# Patient Record
Sex: Male | Born: 1965 | Race: White | Hispanic: No | Marital: Married | Smoking: Former smoker
Health system: Southern US, Community
[De-identification: ages and names within clinical notes are randomized; demographics above are authoritative.]

## PROBLEM LIST (undated history)

## (undated) DIAGNOSIS — K219 Gastro-esophageal reflux disease without esophagitis: Secondary | ICD-10-CM

## (undated) DIAGNOSIS — I10 Essential (primary) hypertension: Secondary | ICD-10-CM

## (undated) DIAGNOSIS — F419 Anxiety disorder, unspecified: Secondary | ICD-10-CM

## (undated) DIAGNOSIS — I2510 Atherosclerotic heart disease of native coronary artery without angina pectoris: Secondary | ICD-10-CM

## (undated) DIAGNOSIS — I219 Acute myocardial infarction, unspecified: Secondary | ICD-10-CM

## (undated) DIAGNOSIS — E785 Hyperlipidemia, unspecified: Secondary | ICD-10-CM

## (undated) DIAGNOSIS — F410 Panic disorder [episodic paroxysmal anxiety] without agoraphobia: Secondary | ICD-10-CM

## (undated) DIAGNOSIS — I429 Cardiomyopathy, unspecified: Secondary | ICD-10-CM

## (undated) DIAGNOSIS — Z9889 Other specified postprocedural states: Secondary | ICD-10-CM

## (undated) DIAGNOSIS — E0590 Thyrotoxicosis, unspecified without thyrotoxic crisis or storm: Secondary | ICD-10-CM

## (undated) DIAGNOSIS — I499 Cardiac arrhythmia, unspecified: Secondary | ICD-10-CM

## (undated) DIAGNOSIS — J45909 Unspecified asthma, uncomplicated: Secondary | ICD-10-CM

## (undated) DIAGNOSIS — E119 Type 2 diabetes mellitus without complications: Secondary | ICD-10-CM

## (undated) HISTORY — DX: Gastro-esophageal reflux disease without esophagitis: K21.9

## (undated) HISTORY — DX: Cardiac arrhythmia, unspecified: I49.9

## (undated) HISTORY — PX: TONSILLECTOMY: SUR1361

## (undated) HISTORY — DX: Type 2 diabetes mellitus without complications: E11.9

## (undated) HISTORY — DX: Atherosclerotic heart disease of native coronary artery without angina pectoris: I25.10

## (undated) HISTORY — DX: Acute myocardial infarction, unspecified: I21.9

## (undated) HISTORY — DX: Essential (primary) hypertension: I10

## (undated) HISTORY — PX: CARDIAC CATHETERIZATION: SHX172

## (undated) HISTORY — PX: CORONARY ANGIOPLASTY: SHX604

## (undated) HISTORY — PX: CORONARY STENT PLACEMENT: SHX6853

---

## 2001-04-13 DIAGNOSIS — Z9889 Other specified postprocedural states: Secondary | ICD-10-CM

## 2001-04-13 HISTORY — DX: Other specified postprocedural states: Z98.890

## 2013-04-13 DIAGNOSIS — I219 Acute myocardial infarction, unspecified: Secondary | ICD-10-CM

## 2013-04-13 HISTORY — DX: Acute myocardial infarction, unspecified: I21.9

## 2014-02-06 ENCOUNTER — Inpatient Hospital Stay: Payer: Self-pay | Admitting: Emergency Medicine

## 2014-02-06 LAB — BASIC METABOLIC PANEL
Anion Gap: 7 (ref 7–16)
BUN: 23 mg/dL — AB (ref 7–18)
CHLORIDE: 107 mmol/L (ref 98–107)
CREATININE: 0.83 mg/dL (ref 0.60–1.30)
Calcium, Total: 8.4 mg/dL — ABNORMAL LOW (ref 8.5–10.1)
Co2: 25 mmol/L (ref 21–32)
EGFR (African American): >60
EGFR (Non-African Amer.): >60
Glucose: 206 mg/dL — ABNORMAL HIGH (ref 65–99)
OSMOLALITY: 287 (ref 275–301)
Potassium: 4.1 mmol/L (ref 3.5–5.1)
Sodium: 139 mmol/L (ref 136–145)

## 2014-02-06 LAB — LIPID PANEL
Cholesterol: 135 mg/dL (ref 0–200)
HDL Cholesterol: 40 mg/dL (ref 40–60)
Ldl Cholesterol, Calc: 81 mg/dL (ref 0–100)
Triglycerides: 70 mg/dL (ref 0–200)
VLDL Cholesterol, Calc: 14 mg/dL (ref 5–40)

## 2014-02-06 LAB — URINALYSIS, COMPLETE
Bacteria: NONE SEEN
Bilirubin,UR: NEGATIVE
Blood: NEGATIVE
Ketone: NEGATIVE
LEUKOCYTE ESTERASE: NEGATIVE
Nitrite: NEGATIVE
Ph: 6 (ref 4.5–8.0)
Protein: NEGATIVE
RBC,UR: 1 /HPF (ref 0–5)
SPECIFIC GRAVITY: 1.007 (ref 1.003–1.030)
SQUAMOUS EPITHELIAL: NONE SEEN
WBC UR: NONE SEEN /HPF (ref 0–5)

## 2014-02-06 LAB — CK-MB
CK-MB: 2.7 ng/mL (ref 0.5–3.6)
CK-MB: 6.2 ng/mL — ABNORMAL HIGH (ref 0.5–3.6)
CK-MB: 7.5 ng/mL — AB (ref 0.5–3.6)

## 2014-02-06 LAB — CBC
HCT: 45.2 % (ref 40.0–52.0)
HGB: 14.4 g/dL (ref 13.0–18.0)
MCH: 30 pg (ref 26.0–34.0)
MCHC: 31.8 g/dL — ABNORMAL LOW (ref 32.0–36.0)
MCV: 95 fL (ref 80–100)
PLATELETS: 178 10*3/uL (ref 150–440)
RBC: 4.78 10*6/uL (ref 4.40–5.90)
RDW: 13.2 % (ref 11.5–14.5)
WBC: 8.9 10*3/uL (ref 3.8–10.6)

## 2014-02-06 LAB — TROPONIN I
Troponin-I: 0.04 ng/mL
Troponin-I: 0.49 ng/mL — ABNORMAL HIGH
Troponin-I: 2.2 ng/mL — ABNORMAL HIGH
Troponin-I: 2.9 ng/mL — ABNORMAL HIGH

## 2014-02-06 LAB — PROTIME-INR
INR: 1
Prothrombin Time: 12.8 secs (ref 11.5–14.7)

## 2014-02-06 LAB — HEPARIN LEVEL (UNFRACTIONATED): Anti-Xa(Unfractionated): 0.41 IU/mL (ref 0.30–0.70)

## 2014-02-06 LAB — PRO B NATRIURETIC PEPTIDE: B-Type Natriuretic Peptide: 18 pg/mL (ref 0–125)

## 2014-02-06 LAB — APTT: Activated PTT: 30.3 secs (ref 23.6–35.9)

## 2014-02-06 LAB — HEMOGLOBIN A1C: Hemoglobin A1C: 7.7 % — ABNORMAL HIGH (ref 4.2–6.3)

## 2014-02-07 LAB — CBC WITH DIFFERENTIAL/PLATELET
BASOS PCT: 0.8 %
Basophil #: 0.1 10*3/uL (ref 0.0–0.1)
EOS ABS: 0.2 10*3/uL (ref 0.0–0.7)
Eosinophil %: 2.8 %
HCT: 48.7 % (ref 40.0–52.0)
HGB: 15.8 g/dL (ref 13.0–18.0)
LYMPHS ABS: 2.2 10*3/uL (ref 1.0–3.6)
Lymphocyte %: 25.7 %
MCH: 30.4 pg (ref 26.0–34.0)
MCHC: 32.5 g/dL (ref 32.0–36.0)
MCV: 94 fL (ref 80–100)
Monocyte #: 0.6 x10 3/mm (ref 0.2–1.0)
Monocyte %: 7.1 %
Neutrophil #: 5.6 10*3/uL (ref 1.4–6.5)
Neutrophil %: 63.6 %
Platelet: 185 10*3/uL (ref 150–440)
RBC: 5.2 10*6/uL (ref 4.40–5.90)
RDW: 13.3 % (ref 11.5–14.5)
WBC: 8.7 10*3/uL (ref 3.8–10.6)

## 2014-02-07 LAB — BASIC METABOLIC PANEL
ANION GAP: 5 — AB (ref 7–16)
BUN: 14 mg/dL (ref 7–18)
CALCIUM: 8.8 mg/dL (ref 8.5–10.1)
CHLORIDE: 104 mmol/L (ref 98–107)
CREATININE: 0.83 mg/dL (ref 0.60–1.30)
Co2: 29 mmol/L (ref 21–32)
EGFR (Non-African Amer.): >60
GLUCOSE: 154 mg/dL — AB (ref 65–99)
OSMOLALITY: 279 (ref 275–301)
Potassium: 3.9 mmol/L (ref 3.5–5.1)
SODIUM: 138 mmol/L (ref 136–145)

## 2014-02-07 LAB — APTT: Activated PTT: 72.6 secs — ABNORMAL HIGH (ref 23.6–35.9)

## 2014-02-07 LAB — HEPARIN LEVEL (UNFRACTIONATED): ANTI-XA(UNFRACTIONATED): 0.45 [IU]/mL (ref 0.30–0.70)

## 2014-02-08 LAB — CBC WITH DIFFERENTIAL/PLATELET
Basophil #: 0.3 10*3/uL — ABNORMAL HIGH (ref 0.0–0.1)
Basophil %: 4.3 %
EOS ABS: 0.2 10*3/uL (ref 0.0–0.7)
EOS PCT: 2.6 %
HCT: 46.7 % (ref 40.0–52.0)
HGB: 15.4 g/dL (ref 13.0–18.0)
Lymphocyte #: 1.4 10*3/uL (ref 1.0–3.6)
Lymphocyte %: 19.6 %
MCH: 31.3 pg (ref 26.0–34.0)
MCHC: 33 g/dL (ref 32.0–36.0)
MCV: 95 fL (ref 80–100)
Monocyte #: 0.6 x10 3/mm (ref 0.2–1.0)
Monocyte %: 7.8 %
Neutrophil #: 4.7 10*3/uL (ref 1.4–6.5)
Neutrophil %: 65.7 %
Platelet: 173 10*3/uL (ref 150–440)
RBC: 4.91 10*6/uL (ref 4.40–5.90)
RDW: 13 % (ref 11.5–14.5)
WBC: 7.2 10*3/uL (ref 3.8–10.6)

## 2014-02-08 LAB — BASIC METABOLIC PANEL
ANION GAP: 7 (ref 7–16)
BUN: 15 mg/dL (ref 7–18)
CHLORIDE: 104 mmol/L (ref 98–107)
CO2: 29 mmol/L (ref 21–32)
Calcium, Total: 8.3 mg/dL — ABNORMAL LOW (ref 8.5–10.1)
Creatinine: 0.95 mg/dL (ref 0.60–1.30)
EGFR (African American): >60
EGFR (Non-African Amer.): >60
Glucose: 213 mg/dL — ABNORMAL HIGH (ref 65–99)
Osmolality: 287 (ref 275–301)
Potassium: 3.9 mmol/L (ref 3.5–5.1)
SODIUM: 140 mmol/L (ref 136–145)

## 2014-02-08 LAB — CK TOTAL AND CKMB (NOT AT ARMC)
CK, Total: 85 U/L
CK-MB: 3 ng/mL (ref 0.5–3.6)

## 2014-02-13 DIAGNOSIS — I429 Cardiomyopathy, unspecified: Secondary | ICD-10-CM | POA: Insufficient documentation

## 2014-04-05 ENCOUNTER — Emergency Department: Payer: Self-pay | Admitting: Emergency Medicine

## 2014-04-05 LAB — PRO B NATRIURETIC PEPTIDE: B-Type Natriuretic Peptide: 30 pg/mL (ref 0–125)

## 2014-04-05 LAB — CBC
HCT: 43.4 % (ref 40.0–52.0)
HGB: 14.5 g/dL (ref 13.0–18.0)
MCH: 31.1 pg (ref 26.0–34.0)
MCHC: 33.3 g/dL (ref 32.0–36.0)
MCV: 93 fL (ref 80–100)
PLATELETS: 210 10*3/uL (ref 150–440)
RBC: 4.65 10*6/uL (ref 4.40–5.90)
RDW: 12.9 % (ref 11.5–14.5)
WBC: 8.5 10*3/uL (ref 3.8–10.6)

## 2014-04-05 LAB — BASIC METABOLIC PANEL
Anion Gap: 7 (ref 7–16)
BUN: 17 mg/dL (ref 7–18)
CALCIUM: 9.1 mg/dL (ref 8.5–10.1)
CHLORIDE: 106 mmol/L (ref 98–107)
CO2: 28 mmol/L (ref 21–32)
CREATININE: 0.86 mg/dL (ref 0.60–1.30)
EGFR (African American): >60
EGFR (Non-African Amer.): >60
GLUCOSE: 125 mg/dL — AB (ref 65–99)
OSMOLALITY: 284 (ref 275–301)
Potassium: 4.1 mmol/L (ref 3.5–5.1)
Sodium: 141 mmol/L (ref 136–145)

## 2014-04-05 LAB — PROTIME-INR
INR: 1
Prothrombin Time: 13.5 secs (ref 11.5–14.7)

## 2014-04-05 LAB — TROPONIN I
Troponin-I: <0.02 ng/mL
Troponin-I: <0.02 ng/mL

## 2014-06-26 ENCOUNTER — Encounter
Admit: 2014-06-26 | Disposition: A | Discharge status: Home / Self Care | Payer: Self-pay | Attending: Internal Medicine | Admitting: Internal Medicine

## 2014-07-02 DIAGNOSIS — E0590 Thyrotoxicosis, unspecified without thyrotoxic crisis or storm: Secondary | ICD-10-CM | POA: Insufficient documentation

## 2014-07-13 ENCOUNTER — Encounter
Admit: 2014-07-13 | Disposition: A | Discharge status: Home / Self Care | Payer: Self-pay | Attending: Internal Medicine | Admitting: Internal Medicine

## 2014-08-04 NOTE — Consult Note (Signed)
Chief Complaint:  Subjective/Chief Complaint States to feel reasonably well denies any groin pain chest pain is improved not with a short of breath he has ambulated well. feels well enough to go home is worried about when he return to work and heavy lifting.   VITAL SIGNS/ANCILLARY NOTES: **Vital Signs.:   29-Oct-15 07:00  Vital Signs Type Routine  Pulse Pulse 67  Respirations Respirations 13  Systolic BP Systolic BP 517  Diastolic BP (mmHg) Diastolic BP (mmHg) 78  Mean BP 88  Pulse Ox % Pulse Ox % 97  Pulse Ox Activity Level  At rest  Oxygen Delivery Room Air/ 21 %  *Intake and Output.:   Shift 29-Oct-15 07:00  IV (Secondary)      In:  300  Urine ml     Out:  950  Length of Stay Totals Intake:  2671.3 Output:  2150    Net:  521.3   Brief Assessment:  GEN well developed, well nourished, no acute distress   Cardiac Regular   Respiratory normal resp effort  clear BS   Gastrointestinal Normal   Gastrointestinal details normal Soft  Nontender  Nondistended  No masses palpable   EXTR negative cyanosis/clubbing, negative edema   Lab Results: Routine Chem:  29-Oct-15 01:20   Glucose, Serum  213  BUN 15  Creatinine (comp) 0.95  Sodium, Serum 140  Potassium, Serum 3.9  Chloride, Serum 104  CO2, Serum 29  Calcium (Total), Serum  8.3  Anion Gap 7  Osmolality (calc) 287  eGFR (African American) >60  eGFR (Non-African American) >60 (eGFR values <27m/min/1.73 m2 may be an indication of chronic kidney disease (CKD). Calculated eGFR, using the MRDR Study equation, is useful in  patients with stable renal function. The eGFR calculation will not be reliable in acutely ill patients when serum creatinine is changing rapidly. It is not useful in patients on dialysis. The eGFR calculation may not be applicable to patients at the low and high extremes of body sizes, pregnant women, and vetetarians.)  Cardiac:  29-Oct-15 01:20   CK, Total 85 (39-308 NOTE: NEW REFERENCE  RANGE  05/15/2013)  CPK-MB, Serum 3.0 (Result(s) reported on 08 Feb 2014 at 01:55AM.)  Routine Hem:  29-Oct-15 01:20   WBC (CBC) 7.2  RBC (CBC) 4.91  Hemoglobin (CBC) 15.4  Hematocrit (CBC) 46.7  Platelet Count (CBC) 173  MCV 95  MCH 31.3  MCHC 33.0  RDW 13.0  Neutrophil % 65.7  Lymphocyte % 19.6  Monocyte % 7.8  Eosinophil % 2.6  Basophil % 4.3  Neutrophil # 4.7  Lymphocyte # 1.4  Monocyte # 0.6  Eosinophil # 0.2  Basophil #  0.3 (Result(s) reported on 08 Feb 2014 at 01:39AM.)   Radiology Results: Cardiology:    27-Oct-15 06:07, ED ECG  Ventricular Rate 75  Atrial Rate 75  P-R Interval 194  QRS Duration 108  QT 386  QTc 431  P Axis 51  R Axis 10  T Axis 43  ECG interpretation   Normal sinus rhythm  Normal ECG  No previous ECGs available  ----------unconfirmed----------  Confirmed by OVERREAD, NOT ([id number]), editor PEARSON, BARBARA ([id number] on 02/06/2014 12:09:50 PM  ED ECG    Assessment/Plan:  Assessment/Plan:  Assessment IMP  coronary disease  unstable angina  non STEMI  hyperlipidemia  hypertension  status post PCI and stent DES  diabetes   Plan PLAN  Brilinta twice a day 90 mg  aspirin 81 mg once a day  low-dose beta-blockade therapy  statin therapy for hyperlipidemia  continue diabetes management  increase activity  continue diabetes management control  have the patient distress today  for follow-up 1-2 weeks as out patient   Electronic Signatures: Lujean Amel D (MD)  (Signed 30-Oct-15 10:38)  Authored: Chief Complaint, VITAL SIGNS/ANCILLARY NOTES, Brief Assessment, Lab Results, Radiology Results, Assessment/Plan   Last Updated: 30-Oct-15 10:38 by Yolonda Kida (MD)

## 2014-08-04 NOTE — Consult Note (Signed)
PATIENT NAME:  Wesley Daniels, Wesley Daniels MR#:  [id number] DATE OF BIRTH:  [birth year]  DATE OF CONSULTATION:  02/06/2014  REFERRING PHYSICIAN:   Dr. Allena Katz CONSULTING PHYSICIAN:  Jericho Alcorn D. Taylorann Tkach, MD  REASON FOR CONSULTATION: A patient with unstable angina, possible non-Q-wave myocardial infarction.   HISTORY OF PRESENT ILLNESS: The patient is a 49 year old white male with past history of hypertension and diabetes, cardiomyopathy, ejection fraction of around 45%, smoking, who cam to the Emergency Room with unstable anginal symptoms that woke him up. He had mid sternal chest pain  radiating to both arms.  He denied diaphoresis, nausea, vomiting and shortness of breath.  He took 4 aspirin from his wife;  finally came to the Emergency Room. EKG was unremarkable.  First cardiac enzymes were elevated at 0.492. He was admitted for possible non-STEMI.  Chest pain symptoms improved.  He has significant risk factors and prior  cardiac disease. He was placed on heparin and was advised to be admitted for further evaluation and care.   PAST MEDICAL HISTORY: Hypertension, diabetes, mild cardiomyopathy, smoking.   ALLERGIES:   FISH   MEDICATIONS: Lisinopril 5 a day, Pravachol once a day.   FAMILY HISTORY: Myocardial infarction in his mother.   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   REVIEW OF SYSTEMS: Denies blackout spells or syncope. Denies nausea or vomiting. Denies fever, chills, sweats. No weight loss. No weight gain. No hemoptysis or hematemesis. No bright red blood per rectum. Denies vision change or hearing change. No significant sputum production or cough.    PHYSICAL EXAMINATION: VITAL SIGNS: Blood pressure 120/80, pulse 70, respiratory rate of 16, afebrile.  HEENT: Normocephalic, atraumatic. Pupils equal and reactive to light.  NECK: Supple. No significant JVD, bruits or adenopathy.   LUNGS: Clear to auscultation and percussion. No significant wheeze, rhonchi, or rale.  HEART: Regular rate  and rhythm. Soft systolic murmur at the apex.  ABDOMEN: Benign.  EXTREMITIES:  Within normal limits. NEUROLOGIC:  Intact. SKIN:  Normal.   EKG: Normal sinus rhythm, nonspecific findings with first troponin of 0.49. Follow-up troponins were greater than 2.  Chest x-ray negative. CBC normal. Basic medical profile normal except for glucose of 206. BNP was normal. PT/INR were normal, liver profile is normal.    ASSESSMENT:  Possible non-Q-wave myocardial infarction, history of hypertension, history of hyperlipidemia, history of diabetes, history of smoking, possible cardiomyopathy in the past, presents with   of the symptoms, elevated troponin.   PLAN:  1.  I agree with admit. Follow up troponins. Follow up EKGs. Follow up cardiac enzymes. Continue anticoagulation. Would recommend invasive evaluation with cardiac catheterization with elevated cardiac enzymes and adequate history for angina.  2.  Evaluation for possible cardiomyopathy. Echocardiogram will be helpful for evaluation and treatment of cardiomyopathy, unclear etiology. Continue ACE inhibitors, maybe consider beta blocker.  3.  Diabetes.  Recommend fasting sugars, hemoglobin A1c.  Institute therapy if necessary, institute diet control.  Initial glucose was over 200.  4.   Hypertension. Recommend ACE inhibitor, possibly a beta blocker for stricter control.  5.  Smoking, advised the patient to refrain from tobacco abuse as part of secondary prevention.    6.  Lipid profile and evaluation. Reportedly was on a statin in the past but quit taking it for some reason.  We will proceed with further evaluation and treatment with statin therapy as necessary. 7.  Deep vein thrombosis prophylaxis.  heparin drip for now  , we should be safe to proceed with  the planned echocardiogram and possible cardiac catheterization.   ____________________________ Bobbie Stack Juliann Pares, MD ddc:jp D: 02/07/2014 07:30:48 ET T: 02/07/2014 08:13:03  ET JOB#: [id number]  cc: Danisa Kopec D. Juliann Pares, MD, <Dictator> Alwyn Pea MD ELECTRONICALLY SIGNED 03/12/2014 9:54

## 2014-08-04 NOTE — Discharge Summary (Signed)
PATIENT NAME:  Wesley Daniels, Wesley Daniels MR#:  [id number] DATE OF BIRTH:  1966-03-05  DATE OF ADMISSION:  02/06/2014 DATE OF DISCHARGE:  02/08/2014  DISCHARGE DIAGNOSES: 1.  Non-ST elevated myocardial infarction, acute.  2.  History ischemic cardiomyopathy, ejection fraction 45%.  3.  Type 1 diabetes.  4.  Hypertension.   DISCHARGE MEDICATIONS: Per [facility] med reconciliation system. Please see for details. Briefly, he will stay on his insulin pump.  He will use the Brilinta prescribed by interventional cardiology as well as a beta blocker post MI. Will consider increasing his statin regimen in the future pending results and discussion about tolerability etc.   HISTORY OF PRESENT ILLNESS:  Please see detailed H and P done on admission.   HOSPITAL COURSE: The patient was admitted with chest pressure, high risk given the diabetes, His hemoglobin A1c was controlled at 7.7, but his troponin increased to 0.49, his CK-MB also increased 6.2, troponin then further increased to 2.2, and seemed to peak at approximately 2.9 with the CK-MB peaking at 7.5. Cardiology was consulted. Cardiac catheterization was done and stent was put in his right coronary artery. He is doing well following that and ambulating without any chest pain, etc. Diabetes remained reasonably well controlled.  He continued with his pump therapy. He will follow up soon with me and cardiology to further assess his needs as noted above.   TIME SPENT: Approximately 35 minutes to those discharge tasks today.    ____________________________ Marya Amsler. Dareen Piano, MD mwa:jp D: 02/08/2014 07:43:38 ET T: 02/08/2014 16:13:52 ET JOB#: [id number]  cc: Marya Amsler. Dareen Piano, MD, <Dictator> Lauro Regulus MD ELECTRONICALLY SIGNED 02/09/2014 8:21

## 2014-08-04 NOTE — Consult Note (Signed)
Chief Complaint:  Subjective/Chief Complaint A patient with unstable anginal symptoms ready for catheterization today improve chest pain mild weakness and fatigue   VITAL SIGNS/ANCILLARY NOTES: **Vital Signs.:   28-Oct-15 11:00  Vital Signs Type Routine  Temperature Temperature (F) 98.2  Celsius 36.7  Temperature Source oral  Pulse Pulse 66  Respirations Respirations 18  Systolic BP Systolic BP 161  Diastolic BP (mmHg) Diastolic BP (mmHg) 72  Mean BP 84  Pulse Ox % Pulse Ox % 96  Pulse Ox Activity Level  At rest  Oxygen Delivery Room Air/ 21 %  *Intake and Output.:   Daily 28-Oct-15 07:00  Grand Totals Intake:  1667.5 Output:      Net:  1667.5 24 Hr.:  1667.5  Oral Intake      In:  240  Heparin      In:  156  IV (Primary)      In:  71.5  IV (Secondary)      In:  1200  Urine ml     Out:  0  Length of Stay Totals Intake:  1667.5 Output:      Net:  1667.5   Brief Assessment:  GEN well developed, well nourished, no acute distress   Cardiac Regular  no murmur   Respiratory normal resp effort  clear BS   Gastrointestinal Normal   Gastrointestinal details normal Soft   EXTR negative cyanosis/clubbing, negative edema   Lab Results: Cardiology:  28-Oct-15 16:54   Ventricular Rate 64  Atrial Rate 64  P-R Interval 180  QRS Duration 106  QT 410  QTc 422  P Axis 43  R Axis 8  T Axis 8  ECG interpretation Normal sinus rhythm Normal ECG When compared with ECG of 06-Feb-2014 06:07, T wave inversion now evident in Inferior leads ----------unconfirmed---------- Confirmed by OVERREAD, NOT ([id number]), editor PEARSON, BARBARA ([id number]) on 02/08/2014 9:47:25 AM  Routine Chem:  28-Oct-15 07:03   Glucose, Serum  154  BUN 14  Creatinine (comp) 0.83  Sodium, Serum 138  Potassium, Serum 3.9  Chloride, Serum 104  CO2, Serum 29  Calcium (Total), Serum 8.8  Anion Gap  5  Osmolality (calc) 279  eGFR (African American) >60  eGFR (Non-African American) >60 (eGFR values  <34m/min/1.73 m2 may be an indication of chronic kidney disease (CKD). Calculated eGFR, using the MRDR Study equation, is useful in  patients with stable renal function. The eGFR calculation will not be reliable in acutely ill patients when serum creatinine is changing rapidly. It is not useful in patients on dialysis. The eGFR calculation may not be applicable to patients at the low and high extremes of body sizes, pregnant women, and vetetarians.)  Routine Coag:  28-Oct-15 07:03   Activated PTT (APTT)  72.6 (A HCT value >55% may artifactually increase the APTT. In one study, the increase was an average of 19%. Reference: "Effect on Routine and Special Coagulation Testing Values of Citrate Anticoagulant Adjustment in Patients with High HCT Values." American Journal of Clinical Pathology 2006;126:400-405.)  Routine Hem:  28-Oct-15 07:03   WBC (CBC) 8.7  RBC (CBC) 5.20  Hemoglobin (CBC) 15.8  Hematocrit (CBC) 48.7  Platelet Count (CBC) 185  MCV 94  MCH 30.4  MCHC 32.5  RDW 13.3  Neutrophil % 63.6  Lymphocyte % 25.7  Monocyte % 7.1  Eosinophil % 2.8  Basophil % 0.8  Neutrophil # 5.6  Lymphocyte # 2.2  Monocyte # 0.6  Eosinophil # 0.2  Basophil # 0.1 (Result(s) reported on 07 Feb 2014 at 07:43AM.)   Radiology Results: XRay:    27-Oct-15 07:01, Chest Portable Single View  Chest Portable Single View   REASON FOR EXAM:    chest pain  COMMENTS:       PROCEDURE: DXR - DXR PORTABLE CHEST SINGLE VIEW  - Feb 06 2014  7:01AM     CLINICAL DATA:  Mid region chest pressure    EXAM:  PORTABLE CHEST - 1 VIEW    COMPARISON:  None.    FINDINGS:  There is no edema or consolidation. Heart is upper normal in size  with pulmonary vascularity within normal limits. No adenopathy. No  pneumothorax. No bone lesions.     IMPRESSION:  No edema or consolidation.      Electronically Signed    By: Lowella Grip M.D.    On: 02/06/2014 07:09         Verified By: Leafy Kindle.  Jasmine December, M.D.,  Cardiology:    27-Oct-15 06:07, ED ECG  Ventricular Rate 75  Atrial Rate 75  P-R Interval 194  QRS Duration 108  QT 386  QTc 431  P Axis 51  R Axis 10  T Axis 43  ECG interpretation   Normal sinus rhythm  Normal ECG  No previous ECGs available  ----------unconfirmed----------  Confirmed by OVERREAD, NOT ([id number]), editor PEARSON, BARBARA ([id number]) on 02/06/2014 12:09:50 PM  ED ECG     27-Oct-15 14:49, Echo Doppler  Echo Doppler   REASON FOR EXAM:      COMMENTS:       PROCEDURE: [facility] - ECHO DOPPLER COMPLETE(TRANSTHOR)  - Feb 06 2014  2:49PM     RESULT: Echocardiogram Report    Patient Name:   Wesley Daniels Date of Exam: 02/06/2014  Medical Rec #:  [id number]         Custom1:  Dateof Birth:  1966-02-17      Height:       74.0 in  Patient Age:    49 years       Weight:       220.0 lb  Patient Gender: M              BSA:          2.26 m??    Indications: Murmur  Sonographer:    Sherrie Sport RDCS  Referring Phys: Fritzi Mandes, A    Summary:   1. Left ventricular ejection fraction, by visual estimation, is 50 to   55%.   2. Normal global left ventricular systolic function.   3. Mildly increased left ventricular septal thickness.   4. Mild mitral valve regurgitation.   5. Mildly increased left ventricular posterior wall thickness.   6. Mild tricuspid regurgitation.  2D AND M-MODE MEASUREMENTS (normal ranges within parentheses):  Left Ventricle:          Normal  IVSd (2D):      1.31 cm (0.7-1.1)  LVPWd (2D):     1.35 cm (0.7-1.1)Aorta/LA:                  Normal  LVIDd (2D):     4.71 cm (3.4-5.7) Aortic Root (2D): 3.40 cm (2.4-3.7)  LVIDs (2D):     3.35 cm           Left Atrium (2D): 3.50 cm (1.9-4.0)  LV FS (2D):     28.9 %   (>25%)  LV EF (2D):     55.5 %   (>50%)  Right Ventricle:                                    RVd (2D):        1.61 cm  LV SYSTOLIC FUNCTION BY 2D PLANIMETRY (MOD):  EF-A4C View: 50.4 % EF-A2C View: 51.6 % EF-Biplane:  09.6 %  LV DIASTOLIC FUNCTION:  MV Peak E: 0.58 m/s E/e' Ratio: 7.70  MV Peak A: 0.54 m/s Decel Time: 327 msec  E/A Ratio: 1.07  SPECTRAL DOPPLER ANALYSIS (where applicable):  Mitral Valve:  MV P1/2 Time: 94.83 msec  MV Area, PHT: 2.32 cm??  Aortic Valve: AoV Max Vel: 1.05 m/s AoV Peak PG: 4.4 mmHg AoV Mean PG:  LVOT Vmax: 0.62 m/s LVOT VTI:  LVOT Diameter: 2.20 cm  AoV Area, Vmax: 2.25 cm?? AoV Area, VTI:  AoV Area, Vmn:  Tricuspid Valve and PA/RV Systolic Pressure: TR Max Velocity: 2.16 m/s RA   Pressure: 5 mmHg RVSP/PASP: 23.7 mmHg  Pulmonic Valve:  PV Max Velocity: 0.79 m/s PV Max PG: 2.5 mmHg PV Mean PG:    PHYSICIAN INTERPRETATION:  Left Ventricle: The left ventricular internal cavity size was normal. LV   septal wall thickness was mildly increased. LV posterior wall thickness   was mildly increased. Global LV systolic function was normal. Left   ventricular ejection fraction, by visual estimation, is 50 to 55%.  Right Ventricle: The right ventricular size is normal. Global RV systolic   function is normal.  Left Atrium: The left atrium is normal in size.  Right Atrium: The right atrium is normal in size.  Pericardium: There is no evidence of pericardial effusion.  Mitral Valve: The mitral valve is normal in structure. Mild mitral valve   regurgitation is seen.  Tricuspid Valve:The tricuspid valve is normal. Mild tricuspid   regurgitation is visualized. The tricuspid regurgitant velocity is 2.16   m/s, and with an assumed right atrial pressure of 5 mmHg, the estimated   right ventricular systolic pressure is normal at 23.7 mmHg.  Aortic Valve: The aortic valve is normal. No evidence of aortic valve   regurgitation is seen.  Pulmonic Valve: The pulmonic valve is normal.    [place] MD  Electronically signed by Eaton MD  Signature Date/Time: 02/06/2014/5:46:09 PM    *** Final ***    IMPRESSION: .        Verified By: Yolonda Kida,  M.D., MD    28-Oct-15 16:54, ECG  Ventricular Rate 64  Atrial Rate 64  P-R Interval 180  QRS Duration 106  QT 410  QTc 422  P Axis 43  R Axis 8  T Axis 8  ECG interpretation   Normal sinus rhythm  Normal ECG  When compared with ECG of 06-Feb-2014 06:07,  T wave inversion now evident in Inferior leads  ----------unconfirmed----------  Confirmed by OVERREAD, NOT ([id number]), editor PEARSON, BARBARA ([id number]) on 02/08/2014 9:47:25 AM  ECG    Assessment/Plan:  Assessment/Plan:  Assessment IMP  unstable angina  non STEMI  coronary disease  history of cardiomyopathy  hypertension  hyperlipidemia  diabetes .   Plan PLAN  continue with microinfarction  follow-up cardiac enzymes  elevated troponin  echocardiogram for cardiomyopathy and unstable angina  low-dose beta-blockade therapy for anginal symptoms  nitrates as necessary for angina  aspirin therapy for secondary prevention  blood pressure control beta-blocker possibly ACE-inhibitor  diabetes management control  statin  therapy   cardiac catheterization today possible PCI and stent   Electronic Signatures: Lujean Amel D (MD)  (Signed 30-Oct-15 10:45)  Authored: Chief Complaint, VITAL SIGNS/ANCILLARY NOTES, Brief Assessment, Lab Results, Radiology Results, Assessment/Plan   Last Updated: 30-Oct-15 10:45 by Yolonda Kida (MD)

## 2014-08-04 NOTE — H&P (Signed)
PATIENT NAME:  ABEER, IVERSEN MR#:  [id number] DATE OF BIRTH:  [birth year]  DATE OF ADMISSION:  02/06/2014  CHIEF COMPLAINT:  Chest pain with bilateral arm pain this morning.  HISTORY OF PRESENT ILLNESS:  Mr. Hagood is a 49 year old gentleman with a past medical history of hypertension, type 2 diabetes and history of cardiomyopathy, EF of 45% in the remote past, with a history of tobacco abuse, comes to the Emergency Room after he started experiencing chest pain which woke him up around 4:30 in the morning.  He describes it as having chest pressure, mid substernal, radiating to bilateral arms.  He denies any diaphoresis, nausea, vomiting or shortness of breath.  It took his wife 4 aspirins and came to the Emergency Room where he is hemodynamically stable, chest pain free.  EKG is normal sinus rhythm.  First set of cardiac enzymes his troponin is elevated to 0.49.  He is being admitted for possible NSTEMI with chest pain and risk factors of hypertension, diabetes.  He started on heparin drip by ER physician, received some nitro as well.    PAST MEDICAL HISTORY:   1.  Hypertension. 2.  Diabetes. 3.  Cardiomyopathy, EF around 45% with echo in the past.   4. The patient had a cardiac cath several years in [place] as part of his workup for cardiomyopathy and did not have any coronary artery disease at that time.   ALLERGIES:  FISH.    MEDICATIONS:  Lisinopril 5 mg daily, pravastatin the patient does not remember the dose but says it is the minimum dose which I am assuming is 10 mg at bedtime.    FAMILY HISTORY:  Mother had MI at age 31.    SOCIAL HISTORY: Works in a factory, smokes a few cigarettes a day. The patient says he quit today.  Smoking cessation counseling was done.  The patient appears motivated, denies any alcohol use.    REVIEW OF SYSTEMS:   CONSTITUTIONAL:  No fever, fatigue, weakness. EYES:  No blurred or double vision.  no cataract. ENT:  No tinnitus, ear pain, hearing loss or  postnasal drip. RESPIRATORY:  No cough, wheeze, hemoptysis or dyspnea. CARDIOVASCULAR:  No chest pain, orthopnea, positive for hypertension. GASTROINTESTINAL:  No nausea, vomiting, diarrhea, abdominal pain. GENITOURINARY:  No dysuria or hematuria.   ENDOCRINE:  No polyuria or nocturia or thyroid problems. HEMATOLOGY:  No anemia or easy bruising. SKIN:  No acne or rash or gout. NEUROLOGIC:  No CVA, TIA, vertigo. PSYCHIATRIC:  No anxiety or depression.  All other systems reviewed and negative.  PHYSICAL EXAMINATION:   GENERAL: The patient is awake, alert, oriented x 3, not in acute distress.   VITAL SIGNS:  He is afebrile, pulse is 71, blood pressure 128/85, sats at 99% on room air.   HEENT:  Atraumatic, normocephalic.  Pupils PERRLA, EMO intact.  Oral mucosa is moist.  NECK:  Supple, no JVD, no carotid bruit. RESPIRATORY:  Clear to auscultation bilaterally.  No rales, rhonchi, respiratory distress or labored breathing. CARDIOVASCULAR:  Both the heart sounds are normal, rate and rhythm regular, PMI not lateralized. Chest nontender, good pedal pulses, good femoral pulses.  No lower extremity edema. SKIN:  Warm and dry.  LABORATORY DATA AND DIAGNOSTIC DATA:  EKG:  Normal sinus rhythm.   Troponin 0.49. CPK-MB is 2.7.  Chest x-ray no acute cardiopulmonary abnormalities.  CBC within normal limits.  Basic metabolic panel within normal limits except glucose of 206.  BNP is 18, PT/INR 12.8 and  1.0.  Lipid profile within normal limits.    ASSESSMENT:  A 49 year old, Schad Melka, with history of hypertension, diabetes, comes in with chest pain that woke him up in the morning with bilateral pain radiation.  He is being admitted with: 1.  Chest pain with elevated troponin of 0.49, no acute EKG changes.  Likely has mild acute non ST segment myocardial infarction.  Given his risk factors of hypertension, diabetes, the patient is already started on IV heparin drip per Emergency Room physician, given  nitroglycerin p.r.n., continue aspirin, lisinopril and pravastatin.  I will also give some low-dose beta-blockers.  I spoke with Dr. Juliann Pares who will see the patient in consultation.  Cycle cardiac enzymes times 3, p.r.n. morphine for pain if needed. 2.  Hypertension, continue lisinopril. 3.  Hyperlipidemia, continue pravastatin. 4.  Type 2 diabetes.  The patient is not on any medication at home. We will check A1c.  The patient will need to be on some oral p.o. medication.  He used to be on insulin in the past. We can also try to put him back on his home dose insulin once the dose is verified.   5.  Deep vein thrombosis prophylaxis.  The patient is already on heparin drip.  The above was discussed with the patient.  No family members were present.  TIME SPENT:  55 minutes.    CODE STATUS:  The patient is a full code.  The case was discussed with Dr. Juliann Pares.        ____________________________ Wylie Hail. Allena Katz, MD sap:DT D: 02/06/2014 12:29:00 ET T: 02/06/2014 12:46:16 ET JOB#: [id number]  cc: Winnie Umali A. Allena Katz, MD, <Dictator> Marcina Millard, MD Willow Ora MD ELECTRONICALLY SIGNED 02/22/2014 7:41

## 2014-08-13 ENCOUNTER — Encounter: Payer: BLUE CROSS/BLUE SHIELD | Attending: Internal Medicine

## 2014-08-13 DIAGNOSIS — Z9861 Coronary angioplasty status: Secondary | ICD-10-CM | POA: Insufficient documentation

## 2014-08-13 DIAGNOSIS — I214 Non-ST elevation (NSTEMI) myocardial infarction: Secondary | ICD-10-CM | POA: Insufficient documentation

## 2014-08-20 DIAGNOSIS — I214 Non-ST elevation (NSTEMI) myocardial infarction: Secondary | ICD-10-CM

## 2014-08-20 DIAGNOSIS — Z9861 Coronary angioplasty status: Secondary | ICD-10-CM

## 2014-08-20 NOTE — Progress Notes (Signed)
Daily Session Note  Patient Details  Name: Wesley Daniels MRN: [id number] Date of Birth: 04-18-1965 Referring Provider:  Yolonda Kida, MD  Encounter Date: 08/20/2014  Check In:     Session Check In - 08/20/14 1629    Check-In   Staff Present Heath Lark RN, BSN, CCRP;Steven Way BS, ACSM EP-C, Exercise Physiologist;Carroll Enterkin RN, BSN   ER physicians immediately available to respond to emergencies See telemetry face sheet for immediately available ER MD   Warm-up and Cool-down Performed on first and last piece of equipment   VAD Patient? No   Pain Assessment   Currently in Pain? No/denies         Goals Met:  Independence with exercise equipment Exercise tolerated well  Goals Unmet:  Not Applicable  Goals Comments: No complaints of cardiac symptoms today.    Dr. Emily Filbert is Medical Director for [facility] and [facility].

## 2014-08-27 DIAGNOSIS — I214 Non-ST elevation (NSTEMI) myocardial infarction: Secondary | ICD-10-CM

## 2014-08-27 DIAGNOSIS — Z9861 Coronary angioplasty status: Secondary | ICD-10-CM

## 2014-08-27 NOTE — Progress Notes (Signed)
Scores data input completed today. Pre scores are carried from previous EMR.

## 2014-08-27 NOTE — Progress Notes (Signed)
Daily Session Note  Patient Details  Name: Wesley Daniels MRN: [id number] Date of Birth: 03-Jul-1965 Referring Provider:  Yolonda Kida, MD  Encounter Date: 08/27/2014  Check In:     Session Check In - 08/27/14 1632    Check-In   Staff Present Heath Lark RN, BSN, CCRP;Tyneisha Hegeman BS, ACSM EP-C, Exercise Physiologist;Carroll Enterkin RN, BSN   ER physicians immediately available to respond to emergencies See telemetry face sheet for immediately available ER MD   Medication changes reported     No   Fall or balance concerns reported    No   Warm-up and Cool-down Performed on first and last piece of equipment   VAD Patient? No   Pain Assessment   Currently in Pain? No/denies         Goals Met:  Proper associated with RPD/PD & O2 Sat Exercise tolerated well No report of cardiac concerns or symptoms Strength training completed today  Goals Unmet:  Not Applicable  Goals Comments:    Dr. Emily Filbert is Medical Director for [facility].

## 2014-08-29 ENCOUNTER — Encounter: Payer: BLUE CROSS/BLUE SHIELD | Admitting: *Deleted

## 2014-08-29 DIAGNOSIS — I214 Non-ST elevation (NSTEMI) myocardial infarction: Secondary | ICD-10-CM

## 2014-08-29 NOTE — Progress Notes (Signed)
Daily Session Note  Patient Details  Name: Wesley Daniels MRN: [id number] Date of Birth: March 11, 1966 Referring Provider:  Yolonda Kida, MD  Encounter Date: 08/29/2014  Check In:     Session Check In - 08/29/14 1824    Check-In   Staff Present Gerlene Burdock RN, BSN;Jaequan Propes Dillard Essex MS, ACSM CEP Exercise Physiologist;Diane Mariana Arn, BSN   ER physicians immediately available to respond to emergencies See telemetry face sheet for immediately available ER MD   Medication changes reported     No   Fall or balance concerns reported    No   VAD Patient? No   Pain Assessment   Currently in Pain? No/denies   Multiple Pain Sites No           Exercise Prescription Changes - 08/29/14 1800    Interval Training   Interval Training Yes   Equipment Treadmill   Comments 5 mph every 3 min for 1 minute      Goals Met:  Independence with exercise equipment Exercise tolerated well Personal goals reviewed No report of cardiac concerns or symptoms  Goals Unmet:  Not Applicable  Goals Comments:   Dr. Emily Filbert is Medical Director for [place] [facility].

## 2014-08-31 ENCOUNTER — Encounter: Payer: Self-pay | Admitting: *Deleted

## 2014-09-02 ENCOUNTER — Encounter: Payer: Self-pay | Admitting: *Deleted

## 2014-09-02 NOTE — Progress Notes (Signed)
Input data from previous EMR to update the Individualized Treatment Plan. 

## 2014-09-03 DIAGNOSIS — Z9861 Coronary angioplasty status: Secondary | ICD-10-CM

## 2014-09-03 DIAGNOSIS — I214 Non-ST elevation (NSTEMI) myocardial infarction: Secondary | ICD-10-CM | POA: Diagnosis not present

## 2014-09-03 NOTE — Progress Notes (Signed)
Cardiac Individual Treatment Plan  Patient Details  Name: Wesley Daniels MRN: [id number] Date of Birth: 12-23-1965 Referring Provider:  Alwyn Pea, MD  Initial Encounter Date:    Patient's Home Medications on Admission: No current outpatient prescriptions on file.  Past Medical History: No past medical history on file.  Tobacco Use: History  Smoking status  . Not on file  Smokeless tobacco  . Not on file    Labs: Recent Review Flowsheet Data    There is no flowsheet data to display.       Exercise Target Goals:    Exercise Program Goal: Individual exercise prescription set with THRR, safety & activity barriers. Participant demonstrates ability to understand and report RPE using BORG scale, to self-measure pulse accurately, and to acknowledge the importance of the exercise prescription.  Exercise Prescription Goal: Starting with aerobic activity 30 plus minutes a day, 3 days per week for initial exercise prescription. Provide home exercise prescription and guidelines that participant acknowledges understanding prior to discharge.  Activity Barriers & Risk Stratification:     Activity Barriers & Risk Stratification - 06/26/14 0928    Activity Barriers & Risk Stratification   Activity Barriers Back Problems;Joint Problems;Other (comment)   Comments left shoulder-multiple partial tendon tears, herniated disc  L2-4  found around 10 years ago      6 Minute Walk:     6 Minute Walk      06/26/14 0930 09/03/14 1707     6 Minute Walk   Phase Initial Discharge    Distance 1690 feet 1955 feet    Walk Time 6 minutes 6 minutes    Resting HR 69 bpm 99 bpm    Resting BP 134/72 mmHg 128/70 mmHg    Max Ex. HR 93 bpm 113 bpm    Max Ex. BP 142/76 mmHg 128/68 mmHg    RPE 9 10       Initial Exercise Prescription:   Exercise Prescription Changes:     Exercise Prescription Changes      08/29/14 1800 09/03/14 1200         Exercise Review   Progression  Yes       Response to Exercise   Blood Pressure (Admit)  110/80 mmHg      Blood Pressure (Exercise)  148/64 mmHg      Blood Pressure (Exit)  112/70 mmHg      Heart Rate (Admit)  74 bpm      Heart Rate (Exercise)  110 bpm      Heart Rate (Exit)  87 bpm      Rating of Perceived Exertion (Exercise)  13      Frequency  Add 1 additional day to program exercise sessions.      Duration  Progress to 50 minutes of aerobic without signs/symptoms of physical distress      Intensity  THRR unchanged      Progression  Continue progressive overload as per policy without signs/symptoms or physical distress.      Resistance Training   Training Prescription  Yes      Weight  2      Interval Training   Interval Training Yes Yes      Equipment Treadmill Treadmill      Comments 5 mph every 3 min for 1 minute 5 mph every 3 min for 1 minute      Treadmill   MPH  4      Grade  10      Minutes  20      Elliptical   Level  12      Speed  4      Minutes  20         Discharge Exercise Prescription:   Nutrition:  Target Goals: Understanding of nutrition guidelines, daily intake of sodium 1500mg , cholesterol 200mg , calories 30% from fat and 7% or less from saturated fats, daily to have 5 or more servings of fruits and vegetables.  Biometrics:     Pre Biometrics - 06/26/14 0932    Pre Biometrics   Height 6' (1.829 m)   Weight 204 lb 12.8 oz (92.897 kg)   Waist Circumference 35.75 inches   Hip Circumference 42 inches   Waist to Hip Ratio 0.85 %   BMI (Calculated) 27.8       Nutrition Therapy Plan and Nutrition Goals:     Nutrition Therapy & Goals - 09/02/14 0931    Nutrition Therapy   Diet   ADA 1900   Fiber 30 grams   Whole Grain Foods 3 servings   Protein 8 ounces/day   Saturated Fats 13 max. grams   Fruits and Vegetables 5 servings/day   Personal Nutrition Goals   Personal Goal #1 Work on increasing whole grain intake   Personal Goal #2 To read labels for saturated and trans fats    Intervention Plan   Intervention Using nutrition plan and personal goals to gain a healthy nutrition lifestyle. Add exercise as prescribed.      Nutrition Discharge: Rate Your Plate Scores:     Rate Your Plate - 16/10/96 1739    Rate Your Plate Scores   Post Score 64   Post Score % 70 %      Nutrition Goals Re-Evaluation:   Psychosocial: Target Goals: Acknowledge presence or absence of depression, maximize coping skills, provide positive support system. Participant is able to verbalize types and ability to use techniques and skills needed for reducing stress and depression.  Initial Review & Psychosocial Screening:   Quality of Life Scores:     Quality of Life - 09/02/14 0934    Quality of Life Scores   Health/Function Pre 18.93 %   Health/Function Post 20.9 %   Socioeconomic Pre 17.94 %   Socioeconomic Post 15.31 %   Psych/Spiritual Pre 10.93 %   Psych/Spiritual Post 10.36 %   Family Pre 11.8 %   Family Post 6.25 %   GLOBAL Pre 16.09 %   GLOBAL Post 15.69 %      PHQ-9:     Recent Review Flowsheet Data    There is no flowsheet data to display.      Psychosocial Evaluation and Intervention:   Psychosocial Re-Evaluation:   Vocational Rehabilitation: Provide vocational rehab assistance to qualifying candidates.   Vocational Rehab Evaluation & Intervention:     Vocational Rehab - 09/02/14 0930    Initial Vocational Rehab Evaluation & Intervention   Assessment shows need for Vocational Rehabilitation No      Education: Education Goals: Education classes will be provided on a weekly basis, covering required topics. Participant will state understanding/return demonstration of topics presented.  Learning Barriers/Preferences:     Learning Barriers/Preferences - 09/02/14 0929    Learning Barriers/Preferences   Learning Barriers None   Learning Preferences None      Education Topics: General Nutrition Guidelines/Fats and Fiber: -Group  instruction provided by verbal, written material, models and posters to present the general guidelines for heart healthy nutrition. Gives an explanation and  review of dietary fats and fiber.   Controlling Sodium/Reading Food Labels: -Group verbal and written material supporting the discussion of sodium use in heart healthy nutrition. Review and explanation with models, verbal and written materials for utilization of the food label.   Exercise Physiology & Risk Factors: - Group verbal and written instruction with models to review the exercise physiology of the cardiovascular system and associated critical values. Details cardiovascular disease risk factors and the goals associated with each risk factor.   Aerobic Exercise & Resistance Training: - Gives group verbal and written discussion on the health impact of inactivity. On the components of aerobic and resistive training programs and the benefits of this training and how to safely progress through these programs.   Flexibility, Balance, General Exercise Guidelines: - Provides group verbal and written instruction on the benefits of flexibility and balance training programs. Provides general exercise guidelines with specific guidelines to those with heart or lung disease. Demonstration and skill practice provided.          Most Recent Value   Date  09/03/14   Educator  SB   Instruction Review Code  2- meets goals/outcomes      Stress Management: - Provides group verbal and written instruction about the health risks of elevated stress, cause of high stress, and healthy ways to reduce stress.   Depression: - Provides group verbal and written instruction on the correlation between heart/lung disease and depressed mood, treatment options, and the stigmas associated with seeking treatment.   Anatomy & Physiology of the Heart: - Group verbal and written instruction and models provide basic cardiac anatomy and physiology, with the  coronary electrical and arterial systems. Review of: AMI, Angina, Valve disease, Heart Failure, Cardiac Arrhythmia, Pacemakers, and the ICD.   Cardiac Procedures: - Group verbal and written instruction and models to describe the testing methods done to diagnose heart disease. Reviews the outcomes of the test results. Describes the treatment choices: Medical Management, Angioplasty, or Coronary Bypass Surgery.   Cardiac Medications: - Group verbal and written instruction to review commonly prescribed medications for heart disease. Reviews the medication, class of the drug, and side effects. Includes the steps to properly store meds and maintain the prescription regimen.   Go Sex-Intimacy & Heart Disease, Get SMART - Goal Setting: - Group verbal and written instruction through game format to discuss heart disease and the return to sexual intimacy. Provides group verbal and written material to discuss and apply goal setting through the application of the S.M.A.R.T. Method.   Other Matters of the Heart: - Provides group verbal, written materials and models to describe Heart Failure, Angina, Valve Disease, and Diabetes in the realm of heart disease. Includes description of the disease process and treatment options available to the cardiac patient.   Exercise & Equipment Safety: - Individual verbal instruction and demonstration of equipment use and safety with use of the equipment.   Infection Prevention: - Provides verbal and written material to individual with discussion of infection control including proper hand washing and proper equipment cleaning during exercise session.   Falls Prevention: - Provides verbal and written material to individual with discussion of falls prevention and safety.   Diabetes: - Individual verbal and written instruction to review signs/symptoms of diabetes, desired ranges of glucose level fasting, after meals and with exercise. Advice that pre and post  exercise glucose checks will be done for 3 sessions at entry of program.    Knowledge Questionnaire Score:     Knowledge  Questionnaire Score - 08/27/14 1742    Knowledge Questionnaire Score   Post Score 24      Personal Goals and Risk Factors at Admission:     Personal Goals and Risk Factors at Admission - 09/02/14 0933    Personal Goals and Risk Factors on Admission   Increase Aerobic Exercise and Physical Activity Yes   Intervention While in program, learn and follow the exercise prescription taught. Start at a low level workload and increase workload after able to maintain previous level for 30 minutes. Increase time before increasing intensity.   Take Less Medication Yes   Intervention Learn your risk factors and begin the lifestyle modifications for risk factor control during your time in the program.   Understand more about Heart/Pulmonary Disease. Yes   Intervention While in program utilize professionals for any questions, and attend the education sessions. Great websites to use are www.americanheart.org or www.lung.org for reliable information.   Diabetes Yes   Goal Blood glucose control identified by blood glucose values, HgbA1C. Participant verbalizes understanding of the signs/symptoms of hyper/hypo glycemia, proper foot care and importance of medication and nutrition plan for blood glucose control.   Intervention Provide nutrition & aerobic exercise along with prescribed medications to achieve blood glucose in normal ranges: Fasting 65-99 mg/dL   Hypertension Yes   Goal Participant will see blood pressure controlled within the values of 140/14mm/Hg or within value directed by their physician.   Intervention Provide nutrition & aerobic exercise along with prescribed medications to achieve BP 140/90 or less.   Lipids Yes   Goal Cholesterol controlled with medications as prescribed, with individualized exercise RX and with personalized nutrition plan. Value goals: LDL < 70mg ,  HDL > 40mg . Participant states understanding of desired cholesterol values and following prescriptions.   Intervention Provide nutrition & aerobic exercise along with prescribed medications to achieve LDL 70mg , HDL >40mg .      Personal Goals and Risk Factors Review:    Personal Goals Discharge:     Comments: 30 day review

## 2014-09-03 NOTE — Progress Notes (Signed)
Daily Session Note  Patient Details  Name: Wesley Daniels MRN: [id number] Date of Birth: January 15, 1966 Referring Provider:  Yolonda Kida, MD  Encounter Date: 09/03/2014  Check In:     Session Check In - 09/03/14 1622    Check-In   Staff Present Heath Lark RN, BSN, CCRP;Lariza Cothron BS, ACSM EP-C, Exercise Physiologist;Carroll Radio producer, BSN   ER physicians immediately available to respond to emergencies See telemetry face sheet for immediately available ER MD   Medication changes reported     No   Fall or balance concerns reported    No   Warm-up and Cool-down Performed on first and last piece of equipment   VAD Patient? No   Pain Assessment   Currently in Pain? No/denies           Exercise Prescription Changes - 09/03/14 1200    Exercise Review   Progression Yes   Response to Exercise   Blood Pressure (Admit) 110/80 mmHg   Blood Pressure (Exercise) 148/64 mmHg   Blood Pressure (Exit) 112/70 mmHg   Heart Rate (Admit) 74 bpm   Heart Rate (Exercise) 110 bpm   Heart Rate (Exit) 87 bpm   Rating of Perceived Exertion (Exercise) 13   Frequency Add 1 additional day to program exercise sessions.   Duration Progress to 50 minutes of aerobic without signs/symptoms of physical distress   Intensity THRR unchanged   Progression Continue progressive overload as per policy without signs/symptoms or physical distress.   Resistance Training   Training Prescription Yes   Weight 2   Interval Training   Interval Training Yes   Equipment Treadmill   Comments 5 mph every 3 min for 1 minute   Treadmill   MPH 4   Grade 10   Minutes 20   Elliptical   Level 12   Speed 4   Minutes 20      Goals Met:  Proper associated with RPD/PD & O2 Sat Exercise tolerated well No report of cardiac concerns or symptoms Strength training completed today  Goals Unmet:  Not Applicable  Goals Comments:    Dr. Emily Filbert is Medical Director for [place] and  [facility].

## 2014-09-05 ENCOUNTER — Encounter: Payer: BLUE CROSS/BLUE SHIELD | Admitting: *Deleted

## 2014-09-05 DIAGNOSIS — I214 Non-ST elevation (NSTEMI) myocardial infarction: Secondary | ICD-10-CM

## 2014-09-05 DIAGNOSIS — Z9861 Coronary angioplasty status: Secondary | ICD-10-CM

## 2014-09-05 NOTE — Progress Notes (Signed)
Daily Session Note  Patient Details  Name: Wesley Daniels MRN: [id number] Date of Birth: 1965-06-18 Referring Provider:  Yolonda Kida, MD  Encounter Date: 09/05/2014  Check In:     Session Check In - 09/05/14 1813    Check-In   Staff Present Candiss Norse MS, ACSM CEP Exercise Physiologist;Carroll Enterkin RN, BSN;Diane Joya Gaskins RN, BSN   ER physicians immediately available to respond to emergencies See telemetry face sheet for immediately available ER MD   Medication changes reported     No   Fall or balance concerns reported    No   Warm-up and Cool-down Performed on first and last piece of equipment   VAD Patient? No   Pain Assessment   Currently in Pain? No/denies         Goals Met:  Independence with exercise equipment Exercise tolerated well No report of cardiac concerns or symptoms Strength training completed today  Goals Unmet:  Not Applicable  Goals Comments: Patient completed 30 of 36 sessions due to insurance.    Dr. Emily Filbert is Medical Director for [facility] and [facility].

## 2014-09-06 ENCOUNTER — Encounter: Payer: Self-pay | Admitting: *Deleted

## 2014-09-06 DIAGNOSIS — Z9861 Coronary angioplasty status: Secondary | ICD-10-CM

## 2014-09-06 NOTE — Progress Notes (Signed)
Discharge Summary  Patient Details  Name: Wesley Daniels MRN: [id number] Date of Birth: 12-29-1965 Referring Provider:  No ref. provider found   Number of Visits: 30  Reason for Discharge:  Patient reached a stable level of exercise. Patient independent in their exercise.  Smoking History:  History  Smoking status  . Not on file  Smokeless tobacco  . Not on file    Diagnosis:  S/P PTCA (percutaneous transluminal coronary angioplasty)  Initial Exercise Prescription:   Discharge Exercise Prescription:     Discharge Prescription - 09/06/14 0900    Resistance Training   Training Prescription Yes   Weight 5   Reps 10-15   Treadmill   MPH 4   Grade 5   Minutes 25   Home Exercise Plan   Plans to continue exercise at [facility] (comment)  Discharge date for 30 visits he was allowed was Sep 05, 2014      Functional Capacity:     6 Minute Walk      06/26/14 0930 06/26/14 1028 09/03/14 1707   6 Minute Walk   Phase Initial Initial  Pre Epic Discharge   Distance 1690 feet 1690 feet 1955 feet   Walk Time 6 minutes 6 minutes 6 minutes   Resting HR 69 bpm 69 bpm 99 bpm   Resting BP 134/72 mmHg 134/72 mmHg 128/70 mmHg   Max Ex. HR 93 bpm 93 bpm 113 bpm   Max Ex. BP 142/76 mmHg 142/76 mmHg 128/68 mmHg   RPE 9 9 10    Symptoms  No       Psychological, QOL, Others - Outcomes: PHQ 2/9: No flowsheet data found.  Quality of Life:     Quality of Life - 09/02/14 0934    Quality of Life Scores   Health/Function Pre 18.93 %   Health/Function Post 20.9 %   Socioeconomic Pre 17.94 %   Socioeconomic Post 15.31 %   Psych/Spiritual Pre 10.93 %   Psych/Spiritual Post 10.36 %   Family Pre 11.8 %   Family Post 6.25 %   GLOBAL Pre 16.09 %   GLOBAL Post 15.69 %      Personal Goals: Goals established at orientation with interventions provided to work toward goal.     Personal Goals and Risk Factors at Admission - 09/02/14 0933    Personal Goals and Risk  Factors on Admission   Increase Aerobic Exercise and Physical Activity Yes   Intervention While in program, learn and follow the exercise prescription taught. Start at a low level workload and increase workload after able to maintain previous level for 30 minutes. Increase time before increasing intensity.   Take Less Medication Yes   Intervention Learn your risk factors and begin the lifestyle modifications for risk factor control during your time in the program.   Understand more about Heart/Pulmonary Disease. Yes   Intervention While in program utilize professionals for any questions, and attend the education sessions. Great websites to use are www.americanheart.org or www.lung.org for reliable information.   Diabetes Yes   Goal Blood glucose control identified by blood glucose values, HgbA1C. Participant verbalizes understanding of the signs/symptoms of hyper/hypo glycemia, proper foot care and importance of medication and nutrition plan for blood glucose control.   Intervention Provide nutrition & aerobic exercise along with prescribed medications to achieve blood glucose in normal ranges: Fasting 65-99 mg/dL   Hypertension Yes   Goal Participant will see blood pressure controlled within the values of 140/69mm/Hg or within value directed by  their physician.   Intervention Provide nutrition & aerobic exercise along with prescribed medications to achieve BP 140/90 or less.   Lipids Yes   Goal Cholesterol controlled with medications as prescribed, with individualized exercise RX and with personalized nutrition plan. Value goals: LDL < , HDL > . Participant states understanding of desired cholesterol values and following prescriptions.   Intervention Provide nutrition & aerobic exercise along with prescribed medications to achieve LDL 70mg , HDL >40mg .       Personal Goals Discharge:   Nutrition & Weight - Outcomes:     Pre Biometrics - 06/26/14 0932    Pre Biometrics   Height 6'  (1.829 m)   Weight 204 lb 12.8 oz (92.897 kg)   Waist Circumference 35.75 inches   Hip Circumference 42 inches   Waist to Hip Ratio 0.85 %   BMI (Calculated) 27.8         Post Biometrics - 09/05/14 1545     Post  Biometrics   Weight 215 lb (97.523 kg)      Nutrition:     Nutrition Therapy & Goals - 09/02/14 0931    Nutrition Therapy   Diet   ADA 1900   Fiber 30 grams   Whole Grain Foods 3 servings   Protein 8 ounces/day   Saturated Fats 13 max. grams   Fruits and Vegetables 5 servings/day   Personal Nutrition Goals   Personal Goal #1 Work on increasing whole grain intake   Personal Goal #2 To read labels for saturated and trans fats   Intervention Plan   Intervention Using nutrition plan and personal goals to gain a healthy nutrition lifestyle. Add exercise as prescribed.      Nutrition Discharge:     Rate Your Plate - 16/10/96 0913    Rate Your Plate Scores   Post Score 64   Post Score % 71 %      Education Questionnaire Score:     Knowledge Questionnaire Score - 08/27/14 1742    Knowledge Questionnaire Score   Post Score 24      Goals reviewed with patient; copy given to patient.

## 2014-09-06 NOTE — Progress Notes (Signed)
Cardiac Individual Treatment Plan  Patient Details  Name: Wesley Daniels MRN: [id number] Date of Birth: Jun 13, 1965 Referring Provider: Juliann Pares Initial Encounter Date:  June 26, 2014 DX PTCA  Patient's Home Medications on Admission:  Current outpatient prescriptions:  .  clopidogrel (PLAVIX) 75 MG tablet, Take by mouth., Disp: , Rfl:  .  glucose blood test strip, Use 6 (six) times daily. Use as instructed., Disp: , Rfl:  .  insulin glulisine (APIDRA) 100 UNIT/ML injection, Use via insulin pump, Disp: , Rfl:  .  aspirin EC 81 MG tablet, Take by mouth., Disp: , Rfl:  .  lisinopril (PRINIVIL,ZESTRIL) 2.5 MG tablet, Take 2.5 mg by mouth., Disp: , Rfl:  .  metoprolol tartrate (LOPRESSOR) 12.5 mg TABS tablet, Take by mouth., Disp: , Rfl:  .  pravastatin (PRAVACHOL) 20 MG tablet, Take 1 tablet by mouth at bedtime, Disp: , Rfl:   Past Medical History: Past Medical History  Diagnosis Date  . Coronary artery disease   . Diabetes mellitus without complication   . Hypertension   . 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 GERD (gastroesophageal reflux disease)   . Myocardial infarction 2003    s/p stent to distal RCA 2003    Tobacco Use: History  Smoking status  . Former Smoker -- 1.00 packs/day for 32 years  . Types: Cigarettes  . Quit date: 02/06/2014  Smokeless tobacco  . Not on file    Labs: Recent Review Flowsheet Data    There is no flowsheet data to display.         POCT Glucose      06/29/14 0834 07/04/14 0810 07/06/14 0805       POCT Blood Glucose   Pre-Exercise 196 mg/dL 409 mg/dL 811 mg/dL     Post-Exercise 914 mg/dL 782 mg/dL 956 mg/dL        Exercise Target Goals:    Exercise Program Goal: Individual exercise prescription set with THRR, safety & activity barriers. Participant demonstrates ability to understand and report RPE using BORG scale, to self-measure pulse accurately, and to acknowledge the importance of the exercise  prescription.  Exercise Prescription Goal: Starting with aerobic activity 30 plus minutes a day, 3 days per week for initial exercise prescription. Provide home exercise prescription and guidelines that participant acknowledges understanding prior to discharge.  Activity Barriers & Risk Stratification:     Activity Barriers & Risk Stratification - 09/06/14 0901    Activity Barriers & Risk Stratification   Activity Barriers Joint Problems;Other (comment)  Joint Problems; Left shoulder partial tendon tears, Herniated disc L2-4 found 10 years ago   Risk Stratification Moderate      6 Minute Walk:     6 Minute Walk      06/26/14 0930 06/26/14 1028 09/03/14 1707   6 Minute Walk   Phase Initial Initial  Pre Epic Discharge   Distance 1690 feet 1690 feet 1955 feet   Walk Time 6 minutes 6 minutes 6 minutes   Resting HR 69 bpm 69 bpm 99 bpm   Resting BP 134/72 mmHg 134/72 mmHg 128/70 mmHg   Max Ex. HR 93 bpm 93 bpm 113 bpm   Max Ex. BP 142/76 mmHg 142/76 mmHg 128/68 mmHg   RPE Symptoms  No       Initial Exercise Prescription:   Exercise Prescription Changes:     Exercise Prescription Changes      08/29/14 1800 09/03/14 1200  Exercise Review   Progression  Yes      Response to Exercise   Blood Pressure (Admit)  110/80 mmHg      Blood Pressure (Exercise)  148/64 mmHg      Blood Pressure (Exit)  112/70 mmHg      Heart Rate (Admit)  74 bpm      Heart Rate (Exercise)  110 bpm      Heart Rate (Exit)  87 bpm      Rating of Perceived Exertion (Exercise)  13      Frequency  Add 1 additional day to program exercise sessions.      Duration  Progress to 50 minutes of aerobic without signs/symptoms of physical distress      Intensity  THRR unchanged      Progression  Continue progressive overload as per policy without signs/symptoms or physical distress.      Resistance Training   Training Prescription  Yes      Weight  2      Interval Training   Interval  Training Yes Yes      Equipment Treadmill Treadmill      Comments 5 mph every 3 min for 1 minute 5 mph every 3 min for 1 minute      Treadmill   MPH  4      Grade  10      Minutes  20      Elliptical   Level  12      Speed  4      Minutes  20         Discharge Exercise Prescription:     Discharge Prescription - 09/06/14 0900    Resistance Training   Training Prescription Yes   Weight 5   Reps 10-15   Treadmill   MPH 4   Grade 5   Minutes 25   Home Exercise Plan   Plans to continue exercise at [facility] (comment)  Discharge date for 30 visits he was allowed was Sep 05, 2014      Nutrition:  Target Goals: Understanding of nutrition guidelines, daily intake of sodium 1500mg , cholesterol 200mg , calories 30% from fat and 7% or less from saturated fats, daily to have 5 or more servings of fruits and vegetables.  Biometrics:     Pre Biometrics - 06/26/14 0932    Pre Biometrics   Height 6' (1.829 m)   Weight 204 lb 12.8 oz (92.897 kg)   Waist Circumference 35.75 inches   Hip Circumference 42 inches   Waist to Hip Ratio 0.85 %   BMI (Calculated) 27.8         Post Biometrics - 09/05/14 1545     Post  Biometrics   Weight 215 lb (97.523 kg)      Nutrition Therapy Plan and Nutrition Goals:     Nutrition Therapy & Goals - 09/02/14 0931    Nutrition Therapy   Diet   ADA 1900   Fiber 30 grams   Whole Grain Foods 3 servings   Protein 8 ounces/day   Saturated Fats 13 max. grams   Fruits and Vegetables 5 servings/day   Personal Nutrition Goals   Personal Goal #1 Work on increasing whole grain intake   Personal Goal #2 To read labels for saturated and trans fats   Intervention Plan   Intervention Using nutrition plan and personal goals to gain a healthy nutrition lifestyle. Add exercise as prescribed.      Nutrition Discharge: Rate  Your Plate Scores:     Rate Your Plate - 16/10/96 0913    Rate Your Plate Scores   Post Score 64   Post Score % 71  %      Nutrition Goals Re-Evaluation:   Psychosocial: Target Goals: Acknowledge presence or absence of depression, maximize coping skills, provide positive support system. Participant is able to verbalize types and ability to use techniques and skills needed for reducing stress and depression.  Initial Review & Psychosocial Screening:   Quality of Life Scores:     Quality of Life - 09/02/14 0934    Quality of Life Scores   Health/Function Pre 18.93 %   Health/Function Post 20.9 %   Socioeconomic Pre 17.94 %   Socioeconomic Post 15.31 %   Psych/Spiritual Pre 10.93 %   Psych/Spiritual Post 10.36 %   Family Pre 11.8 %   Family Post 6.25 %   GLOBAL Pre 16.09 %   GLOBAL Post 15.69 %      PHQ-9:     Recent Review Flowsheet Data    There is no flowsheet data to display.      Psychosocial Evaluation and Intervention:   Psychosocial Re-Evaluation:   Vocational Rehabilitation: Provide vocational rehab assistance to qualifying candidates.   Vocational Rehab Evaluation & Intervention:     Vocational Rehab - 09/02/14 0930    Initial Vocational Rehab Evaluation & Intervention   Assessment shows need for Vocational Rehabilitation No      Education: Education Goals: Education classes will be provided on a weekly basis, covering required topics. Participant will state understanding/return demonstration of topics presented.  Learning Barriers/Preferences:     Learning Barriers/Preferences - 09/02/14 0929    Learning Barriers/Preferences   Learning Barriers None   Learning Preferences None      Education Topics: General Nutrition Guidelines/Fats and Fiber: -Group instruction provided by verbal, written material, models and posters to present the general guidelines for heart healthy nutrition. Gives an explanation and review of dietary fats and fiber.   Controlling Sodium/Reading Food Labels: -Group verbal and written material supporting the discussion of  sodium use in heart healthy nutrition. Review and explanation with models, verbal and written materials for utilization of the food label.   Exercise Physiology & Risk Factors: - Group verbal and written instruction with models to review the exercise physiology of the cardiovascular system and associated critical values. Details cardiovascular disease risk factors and the goals associated with each risk factor.   Aerobic Exercise & Resistance Training: - Gives group verbal and written discussion on the health impact of inactivity. On the components of aerobic and resistive training programs and the benefits of this training and how to safely progress through these programs.   Flexibility, Balance, General Exercise Guidelines: - Provides group verbal and written instruction on the benefits of flexibility and balance training programs. Provides general exercise guidelines with specific guidelines to those with heart or lung disease. Demonstration and skill practice provided.   Stress Management: - Provides group verbal and written instruction about the health risks of elevated stress, cause of high stress, and healthy ways to reduce stress.   Depression: - Provides group verbal and written instruction on the correlation between heart/lung disease and depressed mood, treatment options, and the stigmas associated with seeking treatment.   Anatomy & Physiology of the Heart: - Group verbal and written instruction and models provide basic cardiac anatomy and physiology, with the coronary electrical and arterial systems. Review of: AMI, Angina, Valve disease, Heart  Failure, Cardiac Arrhythmia, Pacemakers, and the ICD.   Cardiac Procedures: - Group verbal and written instruction and models to describe the testing methods done to diagnose heart disease. Reviews the outcomes of the test results. Describes the treatment choices: Medical Management, Angioplasty, or Coronary Bypass  Surgery.   Cardiac Medications: - Group verbal and written instruction to review commonly prescribed medications for heart disease. Reviews the medication, class of the drug, and side effects. Includes the steps to properly store meds and maintain the prescription regimen.   Go Sex-Intimacy & Heart Disease, Get SMART - Goal Setting: - Group verbal and written instruction through game format to discuss heart disease and the return to sexual intimacy. Provides group verbal and written material to discuss and apply goal setting through the application of the S.M.A.R.T. Method.   Other Matters of the Heart: - Provides group verbal, written materials and models to describe Heart Failure, Angina, Valve Disease, and Diabetes in the realm of heart disease. Includes description of the disease process and treatment options available to the cardiac patient.   Exercise & Equipment Safety: - Individual verbal instruction and demonstration of equipment use and safety with use of the equipment.   Infection Prevention: - Provides verbal and written material to individual with discussion of infection control including proper hand washing and proper equipment cleaning during exercise session.   Falls Prevention: - Provides verbal and written material to individual with discussion of falls prevention and safety.   Diabetes: - Individual verbal and written instruction to review signs/symptoms of diabetes, desired ranges of glucose level fasting, after meals and with exercise. Advice that pre and post exercise glucose checks will be done for 3 sessions at entry of program.    Knowledge Questionnaire Score:     Knowledge Questionnaire Score - 08/27/14 1742    Knowledge Questionnaire Score   Post Score 24      Personal Goals and Risk Factors at Admission:     Personal Goals and Risk Factors at Admission - 09/02/14 0933    Personal Goals and Risk Factors on Admission   Increase Aerobic Exercise  and Physical Activity Yes   Intervention While in program, learn and follow the exercise prescription taught. Start at a low level workload and increase workload after able to maintain previous level for 30 minutes. Increase time before increasing intensity.   Take Less Medication Yes   Intervention Learn your risk factors and begin the lifestyle modifications for risk factor control during your time in the program.   Understand more about Heart/Pulmonary Disease. Yes   Intervention While in program utilize professionals for any questions, and attend the education sessions. Great websites to use are www.americanheart.org or www.lung.org for reliable information.   Diabetes Yes   Goal Blood glucose control identified by blood glucose values, HgbA1C. Participant verbalizes understanding of the signs/symptoms of hyper/hypo glycemia, proper foot care and importance of medication and nutrition plan for blood glucose control.   Intervention Provide nutrition & aerobic exercise along with prescribed medications to achieve blood glucose in normal ranges: Fasting 65-99 mg/dL   Hypertension Yes   Goal Participant will see blood pressure controlled within the values of 140/68mm/Hg or within value directed by their physician.   Intervention Provide nutrition & aerobic exercise along with prescribed medications to achieve BP 140/90 or less.   Lipids Yes   Goal Cholesterol controlled with medications as prescribed, with individualized exercise RX and with personalized nutrition plan. Value goals: LDL < 70mg , HDL > 40mg .  Participant states understanding of desired cholesterol values and following prescriptions.   Intervention Provide nutrition & aerobic exercise along with prescribed medications to achieve LDL 70mg , HDL >40mg .      Personal Goals and Risk Factors Review:      Goals and Risk Factor Review      09/05/14 0914           Increase Aerobic Exercise and Physical Activity   Goals  Progress/Improvement seen  Yes       Comments Exercising Interval training on treadmill 3-74mph 5% grade elevation       Take Less Medication   Goals Progress/Improvement seen No       Understand more about Heart/Pulmonary Disease   Goals Progress/Improvement seen  Yes          Personal Goals Discharge:     Comments: Did 30 sessions per his insurance limit.

## 2015-07-24 ENCOUNTER — Other Ambulatory Visit: Payer: Self-pay | Admitting: Specialist

## 2015-08-07 DIAGNOSIS — I2510 Atherosclerotic heart disease of native coronary artery without angina pectoris: Secondary | ICD-10-CM | POA: Insufficient documentation

## 2015-08-13 ENCOUNTER — Encounter
Admission: RE | Admit: 2015-08-13 | Discharge: 2015-08-13 | Disposition: A | Discharge status: Home / Self Care | Payer: Worker's Compensation | Source: Ambulatory Visit | Attending: Specialist | Admitting: Specialist

## 2015-08-13 DIAGNOSIS — Z01810 Encounter for preprocedural cardiovascular examination: Secondary | ICD-10-CM | POA: Insufficient documentation

## 2015-08-13 DIAGNOSIS — Z01812 Encounter for preprocedural laboratory examination: Secondary | ICD-10-CM | POA: Diagnosis present

## 2015-08-13 HISTORY — DX: Other specified postprocedural states: Z98.890

## 2015-08-13 HISTORY — DX: Anxiety disorder, unspecified: F41.9

## 2015-08-13 HISTORY — DX: Unspecified asthma, uncomplicated: J45.909

## 2015-08-13 HISTORY — DX: Hyperlipidemia, unspecified: E78.5

## 2015-08-13 HISTORY — DX: Cardiomyopathy, unspecified: I42.9

## 2015-08-13 HISTORY — DX: Panic disorder (episodic paroxysmal anxiety): F41.0

## 2015-08-13 HISTORY — DX: Thyrotoxicosis, unspecified without thyrotoxic crisis or storm: E05.90

## 2015-08-13 LAB — CBC
HEMATOCRIT: 39.7 % — AB (ref 40.0–52.0)
Hemoglobin: 13.6 g/dL (ref 13.0–18.0)
MCH: 30.8 pg (ref 26.0–34.0)
MCHC: 34.2 g/dL (ref 32.0–36.0)
MCV: 89.9 fL (ref 80.0–100.0)
Platelets: 159 10*3/uL (ref 150–440)
RBC: 4.41 MIL/uL (ref 4.40–5.90)
RDW: 12.7 % (ref 11.5–14.5)
WBC: 5.6 10*3/uL (ref 3.8–10.6)

## 2015-08-13 LAB — BASIC METABOLIC PANEL
ANION GAP: 6 (ref 5–15)
BUN: 15 mg/dL (ref 6–20)
CALCIUM: 8.7 mg/dL — AB (ref 8.9–10.3)
CO2: 26 mmol/L (ref 22–32)
CREATININE: 0.8 mg/dL (ref 0.61–1.24)
Chloride: 109 mmol/L (ref 101–111)
Glucose, Bld: 226 mg/dL — ABNORMAL HIGH (ref 65–99)
Potassium: 4.4 mmol/L (ref 3.5–5.1)
SODIUM: 141 mmol/L (ref 135–145)

## 2015-08-13 NOTE — Patient Instructions (Signed)
  Your procedure is scheduled on: Aug 21, 2015 (Wednesday) Report to Day Surgery.([facility]) SECOND FLOOR To find out your arrival time please call [phone] between 1PM - 3PM on Aug 20, 2015 (Tuesday).  Remember: Instructions that are not followed completely may result in serious medical risk, up to and including death, or upon the discretion of your surgeon and anesthesiologist your surgery may need to be rescheduled.    __x__ 1. Do not eat food or drink liquids after midnight. No gum chewing or hard candies.     __x__ 2. No Alcohol for 24 hours before or after surgery.   ____ 3. Bring all medications with you on the day of surgery if instructed.    __x__ 4. Notify your doctor if there is any change in your medical condition     (cold, fever, infections).     Do not wear jewelry, make-up, hairpins, clips or nail polish.  Do not wear lotions, powders, or perfumes. You may wear deodorant.  Do not shave 48 hours prior to surgery. Men may shave face and neck.  Do not bring valuables to the hospital.    [facility] is not responsible for any belongings or valuables.               Contacts, dentures or bridgework may not be worn into surgery.  Leave your suitcase in the car. After surgery it may be brought to your room.  For patients admitted to the hospital, discharge time is determined by your                treatment team.   Patients discharged the day of surgery will not be allowed to drive home.   Please read over the following fact sheets that you were given:   Surgical Site Infection Prevention   ____ Take these medicines the morning of surgery with A SIP OF WATER:    1.   2.   3.   4.  5.  6.  ____ Fleet Enema (as directed)   _x___ Use CHG Soap as directed  ____ Use inhalers on the day of surgery  ____ Stop metformin 2 days prior to surgery    _x___ Take 1/2 of usual insulin dose the night before surgery and none on the morning of surgery. (LEAVE INSULIN PUMP   AND GLUCOSE MONITOR AS USUAL)  _x___ Stop Coumadin/Plavix/aspirin on (STOP ASPIRIN AND PLAVIX ON MAY 4)  _x___ Stop Anti-inflammatories on (NO NSAIDS)   ____ Stop supplements until after surgery.    ____ Bring C-Pap to the hospital.

## 2015-08-13 NOTE — Pre-Procedure Instructions (Signed)
Cardiac clearance received from Dr.Paraschos and medical clearance received from Dr. Tedd Sias.

## 2015-08-21 ENCOUNTER — Ambulatory Visit
Admission: RE | Admit: 2015-08-21 | Discharge: 2015-08-21 | Disposition: A | Discharge status: Home / Self Care | Payer: Worker's Compensation | Source: Ambulatory Visit | Attending: Specialist | Admitting: Specialist

## 2015-08-21 ENCOUNTER — Encounter
Admission: RE | Disposition: A | Discharge status: Home / Self Care | Payer: Self-pay | Source: Ambulatory Visit | Attending: Specialist

## 2015-08-21 ENCOUNTER — Ambulatory Visit: Payer: Worker's Compensation | Admitting: Registered Nurse

## 2015-08-21 ENCOUNTER — Encounter: Payer: Self-pay | Admitting: *Deleted

## 2015-08-21 DIAGNOSIS — I252 Old myocardial infarction: Secondary | ICD-10-CM | POA: Diagnosis not present

## 2015-08-21 DIAGNOSIS — Z87891 Personal history of nicotine dependence: Secondary | ICD-10-CM | POA: Diagnosis not present

## 2015-08-21 DIAGNOSIS — Y939 Activity, unspecified: Secondary | ICD-10-CM | POA: Diagnosis not present

## 2015-08-21 DIAGNOSIS — Z79899 Other long term (current) drug therapy: Secondary | ICD-10-CM | POA: Insufficient documentation

## 2015-08-21 DIAGNOSIS — M7542 Impingement syndrome of left shoulder: Secondary | ICD-10-CM | POA: Diagnosis not present

## 2015-08-21 DIAGNOSIS — E109 Type 1 diabetes mellitus without complications: Secondary | ICD-10-CM | POA: Diagnosis not present

## 2015-08-21 DIAGNOSIS — I25119 Atherosclerotic heart disease of native coronary artery with unspecified angina pectoris: Secondary | ICD-10-CM | POA: Diagnosis not present

## 2015-08-21 DIAGNOSIS — K219 Gastro-esophageal reflux disease without esophagitis: Secondary | ICD-10-CM | POA: Diagnosis not present

## 2015-08-21 DIAGNOSIS — X58XXXA Exposure to other specified factors, initial encounter: Secondary | ICD-10-CM | POA: Diagnosis not present

## 2015-08-21 DIAGNOSIS — E0590 Thyrotoxicosis, unspecified without thyrotoxic crisis or storm: Secondary | ICD-10-CM | POA: Diagnosis not present

## 2015-08-21 DIAGNOSIS — Z9641 Presence of insulin pump (external) (internal): Secondary | ICD-10-CM | POA: Diagnosis not present

## 2015-08-21 DIAGNOSIS — S4392XD Sprain of unspecified parts of left shoulder girdle, subsequent encounter: Secondary | ICD-10-CM | POA: Diagnosis present

## 2015-08-21 DIAGNOSIS — Z7982 Long term (current) use of aspirin: Secondary | ICD-10-CM | POA: Diagnosis not present

## 2015-08-21 DIAGNOSIS — I255 Ischemic cardiomyopathy: Secondary | ICD-10-CM | POA: Insufficient documentation

## 2015-08-21 DIAGNOSIS — I10 Essential (primary) hypertension: Secondary | ICD-10-CM | POA: Diagnosis not present

## 2015-08-21 DIAGNOSIS — E785 Hyperlipidemia, unspecified: Secondary | ICD-10-CM | POA: Diagnosis not present

## 2015-08-21 DIAGNOSIS — M719 Bursopathy, unspecified: Secondary | ICD-10-CM | POA: Insufficient documentation

## 2015-08-21 DIAGNOSIS — Z794 Long term (current) use of insulin: Secondary | ICD-10-CM | POA: Insufficient documentation

## 2015-08-21 DIAGNOSIS — M7552 Bursitis of left shoulder: Secondary | ICD-10-CM

## 2015-08-21 HISTORY — PX: SHOULDER ARTHROSCOPY WITH DISTAL CLAVICLE RESECTION: SHX5675

## 2015-08-21 LAB — GLUCOSE, CAPILLARY
GLUCOSE-CAPILLARY: 128 mg/dL — AB (ref 65–99)
GLUCOSE-CAPILLARY: 219 mg/dL — AB (ref 65–99)
Glucose-Capillary: 227 mg/dL — ABNORMAL HIGH (ref 65–99)

## 2015-08-21 SURGERY — SHOULDER ARTHROSCOPY WITH DISTAL CLAVICLE RESECTION
Anesthesia: General | Site: Shoulder | Laterality: Left | Wound class: Clean

## 2015-08-21 MED ORDER — WHITE PETROLATUM GEL
Status: AC
  Filled 2015-08-21: qty 5

## 2015-08-21 MED ORDER — BUPIVACAINE-EPINEPHRINE (PF) 0.25% -1:200000 IJ SOLN
INTRAMUSCULAR | Status: DC | PRN
  Administered 2015-08-21 (×2): 30 mL via PERINEURAL

## 2015-08-21 MED ORDER — GABAPENTIN 400 MG PO CAPS: 400.0000 mg | ORAL_CAPSULE | Freq: Three times a day (TID) | ORAL | Status: DC

## 2015-08-21 MED ORDER — CEFAZOLIN SODIUM-DEXTROSE 2-3 GM-% IV SOLR
INTRAVENOUS | Status: DC | PRN
  Administered 2015-08-21: 2 g via INTRAVENOUS

## 2015-08-21 MED ORDER — SODIUM CHLORIDE 0.9 % IV SOLN
INTRAVENOUS | Status: DC
  Administered 2015-08-21: 07:00:00 via INTRAVENOUS

## 2015-08-21 MED ORDER — GABAPENTIN 400 MG PO CAPS
ORAL_CAPSULE | ORAL | Status: AC
  Administered 2015-08-21: 400 mg via ORAL
  Filled 2015-08-21: qty 1

## 2015-08-21 MED ORDER — NEOMYCIN-POLYMYXIN B GU 40-200000 IR SOLN
Status: AC
  Filled 2015-08-21: qty 4

## 2015-08-21 MED ORDER — BUPIVACAINE-EPINEPHRINE (PF) 0.25% -1:200000 IJ SOLN
INTRAMUSCULAR | Status: AC
  Filled 2015-08-21: qty 30

## 2015-08-21 MED ORDER — ROPIVACAINE HCL 5 MG/ML IJ SOLN
INTRAMUSCULAR | Status: AC
  Filled 2015-08-21: qty 40

## 2015-08-21 MED ORDER — MIDAZOLAM HCL 2 MG/2ML IJ SOLN
INTRAMUSCULAR | Status: DC | PRN
  Administered 2015-08-21: 2 mg via INTRAVENOUS

## 2015-08-21 MED ORDER — SUCCINYLCHOLINE CHLORIDE 20 MG/ML IJ SOLN: INTRAMUSCULAR | Status: DC | PRN

## 2015-08-21 MED ORDER — CEFAZOLIN SODIUM-DEXTROSE 2-4 GM/100ML-% IV SOLN: 2.0000 g | INTRAVENOUS | Status: DC

## 2015-08-21 MED ORDER — ONDANSETRON HCL 4 MG/2ML IJ SOLN
4.0000 mg | Freq: Once | INTRAMUSCULAR | Status: DC | PRN
Start: 2015-08-21 — End: 2015-08-21

## 2015-08-21 MED ORDER — EPINEPHRINE HCL 1 MG/ML IJ SOLN
INTRAMUSCULAR | Status: AC
  Filled 2015-08-21: qty 1

## 2015-08-21 MED ORDER — CLINDAMYCIN PHOSPHATE 600 MG/50ML IV SOLN
INTRAVENOUS | Status: AC
  Filled 2015-08-21: qty 50

## 2015-08-21 MED ORDER — LACTATED RINGERS IV SOLN
INTRAVENOUS | Status: DC | PRN
  Administered 2015-08-21 (×2): via INTRAVENOUS

## 2015-08-21 MED ORDER — FAMOTIDINE 20 MG PO TABS
ORAL_TABLET | ORAL | Status: AC
  Administered 2015-08-21: 20 mg via ORAL
  Filled 2015-08-21: qty 1

## 2015-08-21 MED ORDER — EPHEDRINE SULFATE 50 MG/ML IJ SOLN
INTRAMUSCULAR | Status: DC | PRN
  Administered 2015-08-21: 20 mg via INTRAVENOUS

## 2015-08-21 MED ORDER — BUPIVACAINE-EPINEPHRINE (PF) 0.5% -1:200000 IJ SOLN
INTRAMUSCULAR | Status: AC
  Filled 2015-08-21: qty 30

## 2015-08-21 MED ORDER — FAMOTIDINE 20 MG PO TABS
20.0000 mg | ORAL_TABLET | Freq: Once | ORAL | Status: AC
  Administered 2015-08-21: 20 mg via ORAL

## 2015-08-21 MED ORDER — FENTANYL CITRATE (PF) 100 MCG/2ML IJ SOLN
INTRAMUSCULAR | Status: DC | PRN
  Administered 2015-08-21: 100 ug via INTRAVENOUS

## 2015-08-21 MED ORDER — FENTANYL CITRATE (PF) 100 MCG/2ML IJ SOLN: 25.0000 ug | INTRAMUSCULAR | Status: DC | PRN

## 2015-08-21 MED ORDER — EPINEPHRINE HCL 1 MG/ML IJ SOLN
INTRAMUSCULAR | Status: DC | PRN
  Administered 2015-08-21: 6 mL

## 2015-08-21 MED ORDER — GLYCOPYRROLATE 0.2 MG/ML IJ SOLN
INTRAMUSCULAR | Status: DC | PRN
  Administered 2015-08-21: 0.6 mg via INTRAVENOUS

## 2015-08-21 MED ORDER — CLINDAMYCIN PHOSPHATE 600 MG/50ML IV SOLN: 600.0000 mg | Freq: Three times a day (TID) | INTRAVENOUS | Status: DC

## 2015-08-21 MED ORDER — CLINDAMYCIN PHOSPHATE 600 MG/50ML IV SOLN
INTRAVENOUS | Status: DC | PRN
  Administered 2015-08-21: 600 mg via INTRAVENOUS

## 2015-08-21 MED ORDER — HYDROCODONE-ACETAMINOPHEN 5-325 MG PO TABS: 1.0000 | ORAL_TABLET | Freq: Four times a day (QID) | ORAL | Status: DC | PRN

## 2015-08-21 MED ORDER — NEOSTIGMINE METHYLSULFATE 10 MG/10ML IV SOLN
INTRAVENOUS | Status: DC | PRN
  Administered 2015-08-21: 3 mg via INTRAVENOUS

## 2015-08-21 MED ORDER — CHLORHEXIDINE GLUCONATE 4 % EX LIQD: 1.0000 "application " | Freq: Once | CUTANEOUS | Status: DC

## 2015-08-21 MED ORDER — MORPHINE SULFATE (PF) 4 MG/ML IV SOLN
INTRAVENOUS | Status: DC | PRN
  Administered 2015-08-21: 4 mg via INTRAVENOUS

## 2015-08-21 MED ORDER — ONDANSETRON HCL 4 MG/2ML IJ SOLN
INTRAMUSCULAR | Status: DC | PRN
  Administered 2015-08-21: 4 mg via INTRAVENOUS

## 2015-08-21 MED ORDER — ROCURONIUM BROMIDE 100 MG/10ML IV SOLN
INTRAVENOUS | Status: DC | PRN
  Administered 2015-08-21: 50 mg via INTRAVENOUS

## 2015-08-21 MED ORDER — PROPOFOL 10 MG/ML IV BOLUS
INTRAVENOUS | Status: DC | PRN
  Administered 2015-08-21: 40 mg via INTRAVENOUS
  Administered 2015-08-21: 160 mg via INTRAVENOUS

## 2015-08-21 MED ORDER — CEFAZOLIN SODIUM-DEXTROSE 2-4 GM/100ML-% IV SOLN
INTRAVENOUS | Status: AC
  Filled 2015-08-21: qty 100

## 2015-08-21 MED ORDER — GABAPENTIN 400 MG PO CAPS
400.0000 mg | ORAL_CAPSULE | Freq: Once | ORAL | Status: AC
  Administered 2015-08-21: 400 mg via ORAL

## 2015-08-21 MED ORDER — MORPHINE SULFATE (PF) 4 MG/ML IV SOLN
INTRAVENOUS | Status: AC
  Filled 2015-08-21: qty 1

## 2015-08-21 SURGICAL SUPPLY — 52 items
ADAPTER IRRIG TUBE 2 SPIKE SOL (ADAPTER) ×6 IMPLANT
BLADE AGGRESSIVE PLUS 4.0 (BLADE) IMPLANT
BUR AGGRESSIVE+ 5.5 (BURR) IMPLANT
BUR BR 5.5 12 FLUTE (BURR) ×3 IMPLANT
BUR RADIUS 4.0X18.5 (BURR) ×3 IMPLANT
BUR RADIUS 5.5 (BURR) IMPLANT
CANNULA 5.75X7 CRYSTAL CLEAR (CANNULA) IMPLANT
CANNULA 8.5X75 THRED (CANNULA) IMPLANT
CANNULA PARTIAL THREAD 2X7 (CANNULA) IMPLANT
CHLORAPREP W/TINT 26ML (MISCELLANEOUS) ×3 IMPLANT
CONNECTOR PERFECT PASSER (CONNECTOR) IMPLANT
COVER MAYO STAND STRL (DRAPES) ×3 IMPLANT
DRAPE IMP U-DRAPE 54X76 (DRAPES) ×3 IMPLANT
DRAPE SHEET LG 3/4 BI-LAMINATE (DRAPES) ×3 IMPLANT
DRAPE STERI 35X30 U-POUCH (DRAPES) ×3 IMPLANT
GAUZE PETRO XEROFOAM 1X8 (MISCELLANEOUS) ×3 IMPLANT
GAUZE SPONGE 4X4 12PLY STRL (GAUZE/BANDAGES/DRESSINGS) ×3 IMPLANT
GLOVE SURG ORTHO 8.0 STRL STRW (GLOVE) ×3 IMPLANT
GOWN STRL REUS W/ TWL LRG LVL4 (GOWN DISPOSABLE) ×1 IMPLANT
GOWN STRL REUS W/TWL LRG LVL4 (GOWN DISPOSABLE) ×2
IV LACTATED RINGER IRRG 3000ML (IV SOLUTION) ×16
IV LR IRRIG 3000ML ARTHROMATIC (IV SOLUTION) ×8 IMPLANT
KIT RM TURNOVER STRD PROC AR (KITS) ×3 IMPLANT
KIT SHOULDER TRACTION (DRAPES) ×3 IMPLANT
MANIFOLD NEPTUNE II (INSTRUMENTS) ×3 IMPLANT
MAT BLUE FLOOR 46X72 FLO (MISCELLANEOUS) ×3 IMPLANT
NDL SAFETY 18GX1.5 (NEEDLE) ×3 IMPLANT
NEEDLE SPNL 18GX3.5 QUINCKE PK (NEEDLE) ×3 IMPLANT
NS IRRIG 500ML POUR BTL (IV SOLUTION) ×3 IMPLANT
PACK ARTHROSCOPY SHOULDER (MISCELLANEOUS) ×3 IMPLANT
PASSER SUT CAPTURE FIRST (SUTURE) IMPLANT
SET TUBE SUCT SHAVER OUTFL 24K (TUBING) ×3 IMPLANT
SET TUBE TIP INTRA-ARTICULAR (MISCELLANEOUS) ×3 IMPLANT
SLING ARM LRG DEEP (SOFTGOODS) ×3 IMPLANT
SLING ULTRA II LG (MISCELLANEOUS) ×3 IMPLANT
SOL PREP PVP 2OZ (MISCELLANEOUS) ×3
SOLUTION PREP PVP 2OZ (MISCELLANEOUS) ×1 IMPLANT
SUT ETHILON 3 0 FSLX (SUTURE) ×3 IMPLANT
SUT PDS PLUS 0 (SUTURE) ×2
SUT PDS PLUS AB 0 CT-2 (SUTURE) ×1 IMPLANT
SUT PERFECTPASSER WHITE CART (SUTURE) IMPLANT
SUT SMART STITCH CARTRIDGE (SUTURE) IMPLANT
SUT VIC AB 2-0 CT2 27 (SUTURE) IMPLANT
SUT VICRYL 3-0 27IN (SUTURE) IMPLANT
SUTURE MAGNUM WIRE 2X48 BLK (SUTURE) IMPLANT
SYR 20CC LL (SYRINGE) ×3 IMPLANT
SYR 30ML LL (SYRINGE) ×3 IMPLANT
SYR 50ML LL SCALE MARK (SYRINGE) ×3 IMPLANT
TUBING ARTHRO INFLOW-ONLY STRL (TUBING) ×3 IMPLANT
TUBING CONNECTING 10 (TUBING) ×2 IMPLANT
TUBING CONNECTING 10' (TUBING) ×1
WAND HAND CNTRL MULTIVAC 90 (MISCELLANEOUS) ×3 IMPLANT

## 2015-08-21 NOTE — H&P (Signed)
THE PATIENT WAS SEEN PRIOR TO SURGERY TODAY.  HISTORY, ALLERGIES, HOME MEDICATIONS AND OPERATIVE PROCEDURE WERE REVIEWED. RISKS AND BENEFITS OF SURGERY DISCUSSED WITH PATIENT AGAIN.  NO CHANGES FROM INITIAL HISTORY AND PHYSICAL NOTED.    

## 2015-08-21 NOTE — Anesthesia Preprocedure Evaluation (Signed)
Anesthesia Evaluation  Patient identified by MRN, date of birth, ID band Patient awake    Reviewed: Allergy & Precautions, H&P , NPO status , Patient's Chart, lab work & pertinent test results, reviewed documented beta blocker date and time   History of Anesthesia Complications Negative for: history of anesthetic complications  Airway Mallampati: III  TM Distance: >3 FB Neck ROM: full    Dental no notable dental hx. (+) Caps, Teeth Intact, Missing Bridge on the top front and bottom:   Pulmonary neg shortness of breath, asthma (as a child) , neg sleep apnea, neg COPD, neg recent URI, former smoker,    Pulmonary exam normal breath sounds clear to auscultation       Cardiovascular Exercise Tolerance: Good hypertension, (-) angina+ CAD, + Past MI and + Cardiac Stents (placed in 2015)  (-) CABG Normal cardiovascular exam+ dysrhythmias Ventricular Tachycardia and Ventricular Fibrillation (-) Valvular Problems/Murmurs Rhythm:regular Rate:Normal     Neuro/Psych negative neurological ROS  negative psych ROS   GI/Hepatic Neg liver ROS, GERD  Medicated,  Endo/Other  diabetesHyperthyroidism (borderline)   Renal/GU negative Renal ROS  negative genitourinary   Musculoskeletal   Abdominal   Peds  Hematology negative hematology ROS (+)   Anesthesia Other Findings Past Medical History:   Coronary artery disease                                      Hypertension                                                 Arrhythmia                                                     Comment: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, 2014   GERD (gastroesophageal reflux disease)                       Myocardial infarction (HCC)                     2015           Comment:s/p stent to distal RCA 2015   Asthma                                                         Comment:childhood asthma   Anxiety                                                      Panic attack  History of coronary angiogram                   2003         Cardiomyopathy (HCC)                                         Hyperlipidemia                                               Hyperthyroidism                                              Diabetes mellitus without complication (HCC)                   Comment:APIDRA INSULIN PUMP   Reproductive/Obstetrics negative OB ROS                             Anesthesia Physical Anesthesia Plan  ASA: III  Anesthesia Plan: General   Post-op Pain Management:    Induction:   Airway Management Planned:   Additional Equipment:   Intra-op Plan:   Post-operative Plan:   Informed Consent: I have reviewed the patients History and Physical, chart, labs and discussed the procedure including the risks, benefits and alternatives for the proposed anesthesia with the patient or authorized representative who has indicated his/her understanding and acceptance.   Dental Advisory Given  Plan Discussed with: Anesthesiologist, CRNA and Surgeon  Anesthesia Plan Comments:         Anesthesia Quick Evaluation

## 2015-08-21 NOTE — OR Nursing (Signed)
On arrival to [facility] patient has intact insulin pump with basal rate of 1 unit/hour and constant glucose monitoring by bluetooth device

## 2015-08-21 NOTE — Anesthesia Postprocedure Evaluation (Signed)
Anesthesia Post Note  Patient: Wesley Daniels  Procedure(s) Performed: Procedure(s) (LRB): SHOULDER ARTHROSCOPY WITH DISTAL CLAVICLE RESECTION, SUBACROMIAL DECOMPRESSION, BICEPS TENOTOMY (Left)  Patient location during evaluation: PACU Anesthesia Type: General Level of consciousness: awake and alert Pain management: pain level controlled Vital Signs Assessment: post-procedure vital signs reviewed and stable Respiratory status: spontaneous breathing, nonlabored ventilation, respiratory function stable and patient connected to nasal cannula oxygen Cardiovascular status: blood pressure returned to baseline and stable Postop Assessment: no signs of nausea or vomiting Anesthetic complications: no    Last Vitals:  Filed Vitals:   08/21/15 1118 08/21/15 1124  BP: 144/87   Pulse: 78   Temp:  35.6 C  Resp: 16     Last Pain:  Filed Vitals:   08/21/15 1124  PainSc: 0-No pain                 Lenard Simmer

## 2015-08-21 NOTE — OR Nursing (Signed)
Patient to go to OR with insulin pump at 1 unit /hour and glucose monitor per  Karlton Lemon

## 2015-08-21 NOTE — Transfer of Care (Signed)
Immediate Anesthesia Transfer of Care Note  Patient: Wesley Daniels  Procedure(s) Performed: Procedure(s): SHOULDER ARTHROSCOPY WITH DISTAL CLAVICLE RESECTION, SUBACROMIAL DECOMPRESSION, BICEPS TENOTOMY (Left)  Patient Location: PACU  Anesthesia Type:General  Level of Consciousness: awake  Airway & Oxygen Therapy: Patient Spontanous Breathing and Patient connected to face mask oxygen  Post-op Assessment: Report given to RN  Post vital signs: Reviewed and stable  Last Vitals:  Filed Vitals:   08/21/15 0635 08/21/15 1014  BP: 146/82 146/76  Pulse: 75 96  Temp: 36.6 C 36.8 C  Resp: 14 24    Last Pain: There were no vitals filed for this visit.       Complications: No apparent anesthesia complications

## 2015-08-21 NOTE — Anesthesia Procedure Notes (Addendum)
Anesthesia Regional Block:  Interscalene brachial plexus block  Pre-Anesthetic Checklist: ,, timeout performed, Correct Patient, Correct Site, Correct Laterality, Correct Procedure, Correct Position, site marked, Risks and benefits discussed,  Surgical consent,  Pre-op evaluation,  At surgeon's request and post-op pain management  Laterality: Left and Upper  Prep: Maximum Sterile Barrier Precautions used and chloraprep       Needles:  Injection technique: Single-shot  Needle Type: Stimiplex     Needle Length: 5cm 5 cm Needle Gauge: 22 and 22 G    Additional Needles:  Procedures: ultrasound guided (picture in chart) Interscalene brachial plexus block Narrative:  Start time: 08/21/2015 7:48 AM End time: 08/21/2015 7:53 AM Injection made incrementally with aspirations every 5 mL.  Performed by: Personally  Anesthesiologist: KARENZ, ANDREW  Additional Notes: Functioning IV was confirmed and monitors were applied.  A 2Royann ShivShands HoKentuckSantiam Hospita275 6thCase Center For Surgery Endoscopy LLC Soma Surgery Cent17Royann ShivRegional One KentuckSgmc Berrien CampusyHo7630 ThorneBluffton Regional Medical Center Thomasville Surgery Cent3Royann ShivSomerset Outpatient Surgery LLC Dba Raritan Valley Surgery KentucMercy Hospitalk7425 BerkshireMid-Valley Hospital Blueridge Vista Health And Wellne43Royann ShivPristine Surgery CentKentuckOcean Spring Surgical And Endoscopy Cent8555 Third Cheshire Medical CenteroMassachusetts Ave Surgery Cent52Royann ShivEncompass Health Rehabilitation Hospital Of TallaKentuckKaiser Foundation Hospital - San LeandroyClifto8553 Lookout Houston Methodist The Woodlands HospitalLRinggold County Hospit67Royann ShivSouthern Ocean County HoKentuckShawnee Mission Surgery Center LLCy15 GlenlakeAos Surgery Center LLC Coastal Eye Surgery Cent62Royann ShivFayette County HoKentuckRockford CenteryWa4 N. Hill Greater Dayton Surgery CenterAAuburn Regional Medical Cent27Royann ShivParkridge Valley Adult SeKentuckMerit Health RankinyL36 State Rose Ambulatory Surgery Center LPASt. Mary'S General Hospit33Royann ShivEssex SurgicKentuckEye Surgery Center Of ArizonayW57 S. CypressT J Samson Community Hospital Bon Secours Surgery [facility] At Harbour View LLC Dba Bon Secours Surgery Center At Harbour Vi33Royann ShivMethodist Hospital For SKentuckHarbor Beach Community HospitalyLoghil73 Vernon Catalina Surgery CenterLBronson South Haven Hospit57Royann ShivMoberly Surgery CentKentuckBlue Water Asc LLCyP8937 Elm StKaiser Fnd Hosp - FremontrMountain View Regional Hospit18Royann ShivAspirus Ontonagon Hospital, IFKentuckHosp Bella VistayPh8757 TallwoodPremier Ambulatory Surgery Center Shelby Baptist Ambulatory Surgery Center L46Royann ShivPleasant Valley HoKentuckSalem Hospital949 Rock CreekBon Secours Richmond Community Hospital Henry County Health Cent74Royann ShivBoise Va Medical KentucClinton County Outpatient Surgery LLCk182 Myrtle Kindred Hospital Boston - North ShoreAKyle Er & Hospit68Royann ShivMesa SKentuckEye Surgery Center Of Western Ohio LL10 North Mill StCedar Surgical Associates LcrValley Memorial Hospital - Livermo76Royann ShivFauquier HoKentuckNorthside HospitalyM8144 10thSpokane Ear Nose And Throat Clinic Ps Delta Endoscopy Center 72Royann ShivGrand Valley Surgical CentKentuckNorth Baldwin InfirmaryySlaug499 CreekTyler Continue Care Hospital Hosp Episcopal San Lucas49Royann ShivKindred Hospitals-KentuckPalms Behavioral Healthy8853 Marshall StSidney Regional Medical CenterrAkron General Medical Cent38Royann ShivSutter Fairfield Surgery KentucSepulveda Ambulatory Care Center345 Wagon StUva Healthsouth Rehabilitation HospitalrSelect Specialty Hospital - Youngsto62Royann ShivSouth Austin SurgicentKentuckFoothills Hospital9731 SE. AmerigeSt Mary'S Good Samaritan Hospital Sherman Oaks Surgery Cent47Royann ShivInland Valley Surgical PartneKentuckBeach District Surgery Center LPyEa9895 Sugar Select Specialty Hospital Central Pennsylvania YorkRLivingston Healthca29Royann ShivTemple University-Episcopal HKentuckBaptist Hospitals Of Southeast Texas Fannin Behavioral Cen86 NW. GardenKindred Rehabilitation Hospital Arlington George L Mee Memorial Hospit59Royann ShivSan Gabriel Valley Medical KentucSwedishamerican Medical Center BelviderekMo7674 Liberty Centracare Health SystemLAllegiance Specialty Hospital Of Kilgo34Royann ShivNorman Regional Health System -Norman KentucSt Josephs Hospitalk7095 FieldstoneOsf Healthcare System Heart Of Mary Medical Center Continuecare Hospital At Palmetto Health Bapti11Royann ShivOchsner Extended Care Hospital Of KentuckSalem Va Medical Cente65 Bay StMethodist Mansfield Medical CenterrLewis And Clark Orthopaedic Institute LLCetutlandites Hudsonedle was used. Sterile prep and drape,hand hygiene and sterile gloves were used.  Negative aspiration and negative test dose prior to incremental administration of local anesthetic. The patient tolerated the procedure well.      Procedure Name: Intubation Date/Time: 08/21/2015 8:05 AM Performed by: Donell Tomkins Pre-anesthesia Checklist: Patient identified, Timeout performed, Patient being monitored, Suction available and Emergency Drugs available Patient Re-evaluated:Patient Re-evaluated prior to inductionOxygen Delivery Method: Circle system utilized Preoxygenation: Pre-oxygenation with 100% oxygen Intubation Type: IV induction Ventilation: Mask ventilation without difficulty Laryngoscope Size: Mac and 4 Grade View: Grade II Tube type: Oral Number of attempts: 1 Airway Equipment and Method: Stylet Secured at: 22 cm Tube secured with: Tape

## 2015-08-21 NOTE — Op Note (Signed)
08/21/2015  9:58 AM  PATIENT:  Wesley Daniels    PRE-OPERATIVE DIAGNOSIS:  S43.92XD Sprain of unsp parts of left shoulder girdle, subs encntr  POST-OPERATIVE DIAGNOSIS:  Same  PROCEDURE:  LEFT SHOULDER ARTHROSCOPY WITH DISTAL CLAVICLE RESECTION, SUBACROMIAL DECOMPRESSION, BICEPS TENOTOMY  SURGEON:  Zivah Mayr E, MD   .  ANESTHESIA:   General plus interscalene block  PREOPERATIVE INDICATIONS:  Wesley Daniels is a  50 y.o. male with a diagnosis of S25.92XD Sprain of unsp parts of left shoulder girdle, subs encntr who failed conservative measures and elected for surgical management.    The risks benefits and alternatives were discussed with the patient preoperatively including but not limited to the risks of infection, bleeding, nerve injury, cardiopulmonary complications, the need for revision surgery, among others, and the patient was willing to proceed.  OPERATIVE IMPLANTS: None  OPERATIVE FINDINGS: The patient had severe subacromial bursitis and impingement. The anterior acromion was prominent. The ACjoint was arthritic. The glenohumeral joint was intact. The rotator cuff was normal from the underside.  It was intact but frayed on the dorsal surface.  The muscles were hypertrophic and impinging.    OPERATIVE PROCEDURE: The patient was brought to the operating room where satisfactory general endotracheal and interscalene anesthesia were accomplished.The patient was turned into the lateral decubitus position and the shoulder was prepped and draped in a sterile fashion. Arthroscopy was carried out from a posterior portal with accessory portals laterally and anteriorly. The  joint was examined first. The above findings were encountered. The motorized shaver was introduced anteriorly and the undersurface of the rotator cuff probed and lightly debrided. The biceps tendon was frayed badly.  The ArthroCare wand was introduced and the biceps tendon was released completely. The labrum was trimmed  up with the ArthroCare wand as well. The arthroscope was redirected into the subacromial space. There was severe bursitis which was resected with the motorized shaver and ArthroCare wand. The large bur was introduced from a posterior portal and the anterior acromion was debrided. The undersurface of the clavicle was debrided with the bur which was then reintroduced from an anterior portal and the remaining distal clavicle completely excised. Rasp was used on the acromion and clavicle.   Final debridement was carried out with the motorized shaver and the ArthroCare wand. The joint was flushed and the stab wounds and closed with 3-0 nylon suture. Sponge and needle counts were correct.   The dry sterile dressing and was applied along with a TENS and sling. Patient was awakened and taken recovery in good condition.   Tenna Child.D.

## 2016-09-29 DIAGNOSIS — Z0000 Encounter for general adult medical examination without abnormal findings: Secondary | ICD-10-CM | POA: Insufficient documentation

## 2017-01-12 DIAGNOSIS — E1059 Type 1 diabetes mellitus with other circulatory complications: Secondary | ICD-10-CM

## 2017-01-12 DIAGNOSIS — E1042 Type 1 diabetes mellitus with diabetic polyneuropathy: Secondary | ICD-10-CM | POA: Insufficient documentation

## 2017-11-23 DIAGNOSIS — M25511 Pain in right shoulder: Secondary | ICD-10-CM | POA: Insufficient documentation

## 2018-05-17 DIAGNOSIS — N401 Enlarged prostate with lower urinary tract symptoms: Secondary | ICD-10-CM | POA: Insufficient documentation

## 2018-06-22 ENCOUNTER — Emergency Department
Admission: EM | Admit: 2018-06-22 | Discharge: 2018-06-22 | Disposition: A | Discharge status: Home / Self Care | Payer: BLUE CROSS/BLUE SHIELD | Attending: Emergency Medicine | Admitting: Emergency Medicine

## 2018-06-22 ENCOUNTER — Other Ambulatory Visit: Payer: Self-pay

## 2018-06-22 ENCOUNTER — Emergency Department: Payer: BLUE CROSS/BLUE SHIELD

## 2018-06-22 ENCOUNTER — Encounter: Payer: Self-pay | Admitting: Emergency Medicine

## 2018-06-22 DIAGNOSIS — E119 Type 2 diabetes mellitus without complications: Secondary | ICD-10-CM | POA: Diagnosis not present

## 2018-06-22 DIAGNOSIS — Z87891 Personal history of nicotine dependence: Secondary | ICD-10-CM | POA: Insufficient documentation

## 2018-06-22 DIAGNOSIS — I2510 Atherosclerotic heart disease of native coronary artery without angina pectoris: Secondary | ICD-10-CM | POA: Insufficient documentation

## 2018-06-22 DIAGNOSIS — R109 Unspecified abdominal pain: Secondary | ICD-10-CM | POA: Insufficient documentation

## 2018-06-22 DIAGNOSIS — Z9641 Presence of insulin pump (external) (internal): Secondary | ICD-10-CM | POA: Insufficient documentation

## 2018-06-22 DIAGNOSIS — I252 Old myocardial infarction: Secondary | ICD-10-CM | POA: Diagnosis not present

## 2018-06-22 DIAGNOSIS — I10 Essential (primary) hypertension: Secondary | ICD-10-CM | POA: Insufficient documentation

## 2018-06-22 DIAGNOSIS — J45909 Unspecified asthma, uncomplicated: Secondary | ICD-10-CM | POA: Insufficient documentation

## 2018-06-22 LAB — CBC WITH DIFFERENTIAL/PLATELET
ABS IMMATURE GRANULOCYTES: 0.01 10*3/uL (ref 0.00–0.07)
BASOS ABS: 0.1 10*3/uL (ref 0.0–0.1)
Basophils Relative: 1 %
EOS PCT: 5 %
Eosinophils Absolute: 0.4 10*3/uL (ref 0.0–0.5)
HCT: 45.6 % (ref 39.0–52.0)
HEMOGLOBIN: 14.5 g/dL (ref 13.0–17.0)
Immature Granulocytes: 0 %
LYMPHS PCT: 33 %
Lymphs Abs: 2.7 10*3/uL (ref 0.7–4.0)
MCH: 29.7 pg (ref 26.0–34.0)
MCHC: 31.8 g/dL (ref 30.0–36.0)
MCV: 93.3 fL (ref 80.0–100.0)
Monocytes Absolute: 0.8 10*3/uL (ref 0.1–1.0)
Monocytes Relative: 9 %
NEUTROS ABS: 4.2 10*3/uL (ref 1.7–7.7)
NRBC: 0 % (ref 0.0–0.2)
Neutrophils Relative %: 52 %
PLATELETS: 229 10*3/uL (ref 150–400)
RBC: 4.89 MIL/uL (ref 4.22–5.81)
RDW: 12.6 % (ref 11.5–15.5)
WBC: 8.1 10*3/uL (ref 4.0–10.5)

## 2018-06-22 LAB — URINALYSIS, COMPLETE (UACMP) WITH MICROSCOPIC
Bacteria, UA: NONE SEEN
Bilirubin Urine: NEGATIVE
Glucose, UA: NEGATIVE mg/dL
KETONES UR: NEGATIVE mg/dL
LEUKOCYTE UA: NEGATIVE
Nitrite: NEGATIVE
PH: 5 (ref 5.0–8.0)
PROTEIN: NEGATIVE mg/dL
SQUAMOUS EPITHELIAL / LPF: NONE SEEN (ref 0–5)
Specific Gravity, Urine: 1.004 — ABNORMAL LOW (ref 1.005–1.030)

## 2018-06-22 LAB — COMPREHENSIVE METABOLIC PANEL
ALK PHOS: 50 U/L (ref 38–126)
ALT: 17 U/L (ref 0–44)
ANION GAP: 9 (ref 5–15)
AST: 19 U/L (ref 15–41)
Albumin: 4.1 g/dL (ref 3.5–5.0)
BUN: 20 mg/dL (ref 6–20)
CALCIUM: 9.2 mg/dL (ref 8.9–10.3)
CO2: 24 mmol/L (ref 22–32)
Chloride: 104 mmol/L (ref 98–111)
Creatinine, Ser: 0.76 mg/dL (ref 0.61–1.24)
GFR calc Af Amer: >60 mL/min (ref >60)
GFR calc non Af Amer: >60 mL/min (ref >60)
GLUCOSE: 115 mg/dL — AB (ref 70–99)
Potassium: 3.7 mmol/L (ref 3.5–5.1)
Sodium: 137 mmol/L (ref 135–145)
Total Bilirubin: 0.8 mg/dL (ref 0.3–1.2)
Total Protein: 7.8 g/dL (ref 6.5–8.1)

## 2018-06-22 NOTE — ED Triage Notes (Signed)
Patient ambulatory to triage with steady gait, without difficulty or distress noted; pt reports right flank/side pain today with no accomp symptoms; denies hx of same

## 2018-06-22 NOTE — ED Provider Notes (Signed)
[facility] Emergency Department Provider Note       Time seen: ----------------------------------------- 9:46 PM on 06/22/2018 -----------------------------------------   I have reviewed the triage vital signs and the nursing notes.  HISTORY   Chief Complaint Flank Pain    HPI Wesley Daniels is a 53 y.o. male with a history of anxiety, cardiomyopathy, coronary disease, hyperlipidemia, hypertension, hyperthyroidism, MI who presents to the ED for right-sided flank pain.  Patient reports right flank pain today with no accompanied symptoms.  He denies a history of same.  He denies fevers, chills or other complaints.  Past Medical History:  Diagnosis Date  . Anxiety   . 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 Asthma    childhood asthma  . Cardiomyopathy (HCC)   . Coronary artery disease   . Diabetes mellitus without complication (HCC)    APIDRA INSULIN PUMP  . GERD (gastroesophageal reflux disease)   . History of coronary angiogram 2003  . Hyperlipidemia   . Hypertension   . Hyperthyroidism   . Myocardial infarction [facility]) 2015   s/p stent to distal RCA 2015  . Panic attack     There are no active problems to display for this patient.   Past Surgical History:  Procedure Laterality Date  . CARDIAC CATHETERIZATION    . CORONARY ANGIOPLASTY    . SHOULDER ARTHROSCOPY WITH DISTAL CLAVICLE RESECTION Left 08/21/2015   Procedure: SHOULDER ARTHROSCOPY WITH DISTAL CLAVICLE RESECTION, SUBACROMIAL DECOMPRESSION, BICEPS TENOTOMY;  Surgeon: Deeann Saint, MD;  Location: [facility] ORS;  Service: Orthopedics;  Laterality: Left;  . TONSILLECTOMY      Allergies Other  Social History Social History   Tobacco Use  . Smoking status: Former Smoker    Packs/day: 1.00    Years: 32.00    Pack years: 32.00    Types: Cigarettes    Last attempt to quit: 02/06/2014    Years since quitting: 4.3  . Smokeless tobacco: Never Used  Substance Use Topics  .  Alcohol use: No  . Drug use: No    Review of Systems Constitutional: Negative for fever. Cardiovascular: Negative for chest pain. Respiratory: Negative for shortness of breath. Gastrointestinal: Positive for flank pain Musculoskeletal: Negative for back pain. Skin: Negative for rash. Neurological: Negative for headaches, focal weakness or numbness.  All systems negative/normal/unremarkable except as stated in the HPI  ____________________________________________   PHYSICAL EXAM:  VITAL SIGNS: ED Triage Vitals  Enc Vitals Group     BP 06/22/18 2108 138/83     Pulse Rate 06/22/18 2108 61     Resp 06/22/18 2108 19     Temp 06/22/18 2108 98 F (36.7 C)     Temp Source 06/22/18 2108 Oral     SpO2 06/22/18 2108 99 %     Weight 06/22/18 2027 225 lb (102.1 kg)     Height 06/22/18 2027 6' (1.829 m)     Head Circumference --      Peak Flow --      Pain Score 06/22/18 2027 4     Pain Loc --      Pain Edu? --      Excl. in [place]? --     Constitutional: Alert and oriented. Well appearing and in no distress. Eyes: Conjunctivae are normal. Normal extraocular movements. ENT      Head: Normocephalic and atraumatic.      Nose: No congestion/rhinnorhea.      Mouth/Throat: Mucous membranes are moist.  Neck: No stridor. Cardiovascular: Normal rate, regular rhythm. No murmurs, rubs, or gallops. Respiratory: Normal respiratory effort without tachypnea nor retractions. Breath sounds are clear and equal bilaterally. No wheezes/rales/rhonchi. Gastrointestinal: Mild right flank tenderness, no rebound or guarding.  Normal bowel sounds. Musculoskeletal: Nontender with normal range of motion in extremities. No lower extremity tenderness nor edema. Neurologic:  Normal speech and language. No gross focal neurologic deficits are appreciated.  Skin:  Skin is warm, dry and intact. No rash noted. Psychiatric: Mood and affect are normal. Speech and behavior are normal.   ___________________________________________  ED COURSE:  As part of my medical decision making, I reviewed the following data within the electronic MEDICAL RECORD NUMBER History obtained from family if available, nursing notes, old chart and ekg, as well as notes from prior ED visits. Patient presented for flank pain, we will assess with labs and imaging as indicated at this time.   Procedures ____________________________________________   LABS (pertinent positives/negatives)  Labs Reviewed  COMPREHENSIVE METABOLIC PANEL - Abnormal; Notable for the following components:      Result Value   Glucose, Bld 115 (*)    All other components within normal limits  URINALYSIS, COMPLETE (UACMP) WITH MICROSCOPIC - Abnormal; Notable for the following components:   Color, Urine STRAW (*)    APPearance CLEAR (*)    Specific Gravity, Urine 1.004 (*)    Hgb urine dipstick SMALL (*)    All other components within normal limits  CBC WITH DIFFERENTIAL/PLATELET    RADIOLOGY Images were viewed by me  CT renal protocol IMPRESSION: 1. No obstructive uropathy or nephrolithiasis. 2. Fat containing inguinal hernias bilaterally. ____________________________________________   DIFFERENTIAL DIAGNOSIS   Renal colic, UTI, pyelonephritis, muscle strain  FINAL ASSESSMENT AND PLAN  Flank pain   Plan: The patient had presented for right flank pain. Patient's labs are reassuring. Patient's imaging not reveal any acute process.  He is cleared for outpatient follow-up.   Ulice Dash, MD    Note: This note was generated in part or whole with voice recognition software. Voice recognition is usually quite accurate but there are transcription errors that can and very often do occur. I apologize for any typographical errors that were not detected and corrected.  Emily Filbert, MD 06/22/18 2222

## 2018-07-11 DIAGNOSIS — Z9641 Presence of insulin pump (external) (internal): Secondary | ICD-10-CM | POA: Insufficient documentation

## 2019-03-31 ENCOUNTER — Ambulatory Visit: Payer: BC Managed Care – PPO | Attending: Internal Medicine

## 2019-03-31 DIAGNOSIS — Z20822 Contact with and (suspected) exposure to covid-19: Secondary | ICD-10-CM

## 2019-04-01 LAB — NOVEL CORONAVIRUS, NAA: SARS-CoV-2, NAA: NOT DETECTED

## 2019-05-03 DIAGNOSIS — R002 Palpitations: Secondary | ICD-10-CM | POA: Insufficient documentation

## 2019-05-26 ENCOUNTER — Ambulatory Visit: Payer: BC Managed Care – PPO | Attending: Internal Medicine

## 2019-05-26 DIAGNOSIS — Z20822 Contact with and (suspected) exposure to covid-19: Secondary | ICD-10-CM

## 2019-05-27 LAB — NOVEL CORONAVIRUS, NAA: SARS-CoV-2, NAA: NOT DETECTED

## 2019-07-05 ENCOUNTER — Ambulatory Visit: Payer: BC Managed Care – PPO | Attending: Internal Medicine

## 2019-07-05 DIAGNOSIS — Z20822 Contact with and (suspected) exposure to covid-19: Secondary | ICD-10-CM

## 2019-07-06 LAB — SARS-COV-2, NAA 2 DAY TAT

## 2019-07-06 LAB — NOVEL CORONAVIRUS, NAA: SARS-CoV-2, NAA: NOT DETECTED

## 2019-08-17 ENCOUNTER — Other Ambulatory Visit: Payer: Self-pay

## 2019-08-17 ENCOUNTER — Emergency Department
Admission: EM | Admit: 2019-08-17 | Discharge: 2019-08-17 | Disposition: A | Discharge status: Home / Self Care | Payer: No Typology Code available for payment source | Attending: Emergency Medicine | Admitting: Emergency Medicine

## 2019-08-17 ENCOUNTER — Emergency Department: Payer: No Typology Code available for payment source

## 2019-08-17 DIAGNOSIS — S060X0A Concussion without loss of consciousness, initial encounter: Secondary | ICD-10-CM | POA: Diagnosis not present

## 2019-08-17 DIAGNOSIS — Y929 Unspecified place or not applicable: Secondary | ICD-10-CM | POA: Insufficient documentation

## 2019-08-17 DIAGNOSIS — E039 Hypothyroidism, unspecified: Secondary | ICD-10-CM | POA: Insufficient documentation

## 2019-08-17 DIAGNOSIS — Y939 Activity, unspecified: Secondary | ICD-10-CM | POA: Diagnosis not present

## 2019-08-17 DIAGNOSIS — E119 Type 2 diabetes mellitus without complications: Secondary | ICD-10-CM | POA: Diagnosis not present

## 2019-08-17 DIAGNOSIS — I10 Essential (primary) hypertension: Secondary | ICD-10-CM | POA: Diagnosis not present

## 2019-08-17 DIAGNOSIS — Z7982 Long term (current) use of aspirin: Secondary | ICD-10-CM | POA: Diagnosis not present

## 2019-08-17 DIAGNOSIS — W2209XA Striking against other stationary object, initial encounter: Secondary | ICD-10-CM | POA: Insufficient documentation

## 2019-08-17 DIAGNOSIS — S0990XA Unspecified injury of head, initial encounter: Secondary | ICD-10-CM | POA: Diagnosis present

## 2019-08-17 DIAGNOSIS — Z87891 Personal history of nicotine dependence: Secondary | ICD-10-CM | POA: Diagnosis not present

## 2019-08-17 DIAGNOSIS — Z794 Long term (current) use of insulin: Secondary | ICD-10-CM | POA: Insufficient documentation

## 2019-08-17 DIAGNOSIS — Z7901 Long term (current) use of anticoagulants: Secondary | ICD-10-CM | POA: Diagnosis not present

## 2019-08-17 DIAGNOSIS — Y990 Civilian activity done for income or pay: Secondary | ICD-10-CM | POA: Insufficient documentation

## 2019-08-17 DIAGNOSIS — I2510 Atherosclerotic heart disease of native coronary artery without angina pectoris: Secondary | ICD-10-CM | POA: Insufficient documentation

## 2019-08-17 DIAGNOSIS — Z79899 Other long term (current) drug therapy: Secondary | ICD-10-CM | POA: Insufficient documentation

## 2019-08-17 NOTE — ED Triage Notes (Signed)
Pt works for [employer] and was advised to come to the ED. Spoke with Barbara Cower, Solicitor and was advised no WC testing was required.

## 2019-08-17 NOTE — ED Notes (Signed)
Pt hit head on metal rod at work 2 days ago, has had headache and dizziness since. Pt with NAD at present, able to walk to room, pt c/o slight dizziness when changing positions.

## 2019-08-17 NOTE — ED Provider Notes (Signed)
[facility] Emergency Department Provider Note  ____________________________________________  Time seen: Approximately 3:38 PM  I have reviewed the triage vital signs and the nursing notes.   HISTORY  Chief Complaint Head Injury    HPI Wesley Daniels is a 54 y.o. male that presents to the emergency department for evaluation of head injury.  Patient was at work 2 days ago when he stood up and hit his head on a metal bar.  He did not lose consciousness but did have some dizziness for about 5 minutes after injury.  Later that night he developed a "slight" headache.  He continued to work the rest of the day and returned to work yesterday.  He went to the gym last night.  He has had some intermittent dizziness since.  He woke up this morning with photophobia.  He continues to have an on and off minimal headache.  He has an abrasion to the top of his head.  He is on Plavix.  He read on the Internet that he probably should not have went to the gym and should be resting so he came to the emergency department for evaluation.  No nausea.   Past Medical History:  Diagnosis Date  . Anxiety   . 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 Asthma    childhood asthma  . Cardiomyopathy (HCC)   . Coronary artery disease   . Diabetes mellitus without complication (HCC)    APIDRA INSULIN PUMP  . GERD (gastroesophageal reflux disease)   . History of coronary angiogram 2003  . Hyperlipidemia   . Hypertension   . Hyperthyroidism   . Myocardial infarction [facility]) 2015   s/p stent to distal RCA 2015  . Panic attack     There are no problems to display for this patient.   Past Surgical History:  Procedure Laterality Date  . CARDIAC CATHETERIZATION    . CORONARY ANGIOPLASTY    . SHOULDER ARTHROSCOPY WITH DISTAL CLAVICLE RESECTION Left 08/21/2015   Procedure: SHOULDER ARTHROSCOPY WITH DISTAL CLAVICLE RESECTION, SUBACROMIAL DECOMPRESSION, BICEPS TENOTOMY;  Surgeon: Deeann Saint, MD;  Location: [facility] ORS;  Service: Orthopedics;  Laterality: Left;  . TONSILLECTOMY      Prior to Admission medications   Medication Sig Start Date End Date Taking? Authorizing Provider  aspirin EC 81 MG tablet Take 81 mg by mouth daily.     [provider]  clopidogrel (PLAVIX) 75 MG tablet Take 75 mg by mouth daily.    [provider]  gabapentin (NEURONTIN) 400 MG capsule Take 1 capsule (400 mg total) by mouth 3 (three) times daily. 08/21/15   Deeann Saint, MD  HYDROcodone-acetaminophen (NORCO) 5-325 MG tablet Take 1-2 tablets by mouth every 6 (six) hours as needed. 08/21/15   Deeann Saint, MD  insulin glulisine (APIDRA) 100 UNIT/ML injection Use via insulin pump 08/23/13   [provider]  lisinopril (PRINIVIL,ZESTRIL) 2.5 MG tablet Take 2.5 mg by mouth at bedtime.     [provider]  metoprolol tartrate (LOPRESSOR) 12.5 mg TABS tablet Take 12.5 mg by mouth at bedtime.     [provider]  pravastatin (PRAVACHOL) 20 MG tablet Take 20 mg by mouth at bedtime. Take 1 tablet by mouth at bedtime    [provider]    Allergies Other  History reviewed. No pertinent family history.  Social History Social History   Tobacco Use  . Smoking status: Former Smoker    Packs/day: 1.00  Years: 32.00    Pack years: 32.00    Types: Cigarettes    Quit date: 02/06/2014    Years since quitting: 5.5  . Smokeless tobacco: Never Used  Substance Use Topics  . Alcohol use: No  . Drug use: No     Review of Systems  Cardiovascular: No chest pain. Respiratory: No SOB. Gastrointestinal: No abdominal pain.  No nausea, no vomiting.  Musculoskeletal: Negative for musculoskeletal pain. Skin: Negative for rash, lacerations, ecchymosis.  Positive for abrasion. Neurological: Negative for numbness or tingling.  Positive for headache.   ____________________________________________   PHYSICAL EXAM:  VITAL SIGNS: ED Triage Vitals   Enc Vitals Group     BP 08/17/19 1330 (!) 144/76     Pulse Rate 08/17/19 1330 74     Resp 08/17/19 1330 20     Temp 08/17/19 1330 97.8 F (36.6 C)     Temp Source 08/17/19 1330 Oral     SpO2 08/17/19 1330 98 %     Weight 08/17/19 1330 225 lb (102.1 kg)     Height 08/17/19 1330 6' (1.829 m)     Head Circumference --      Peak Flow --      Pain Score 08/17/19 1343 4     Pain Loc --      Pain Edu? --      Excl. in [place]? --      Constitutional: Alert and oriented. Well appearing and in no acute distress. Eyes: Conjunctivae are normal. PERRL. EOMI. Head: Atraumatic. ENT:      Ears:      Nose: No congestion/rhinnorhea.      Mouth/Throat: Mucous membranes are moist.  Neck: No stridor. No cervical spine tenderness to palpation. Cardiovascular: Normal rate, regular rhythm.  Good peripheral circulation. Respiratory: Normal respiratory effort without tachypnea or retractions. Lungs CTAB. Good air entry to the bases with no decreased or absent breath sounds. Musculoskeletal: Full range of motion to all extremities. No gross deformities appreciated. Neurologic:  Normal speech and language. No gross focal neurologic deficits are appreciated.  Skin:  Skin is warm, dry and intact. No rash noted. Psychiatric: Mood and affect are normal. Speech and behavior are normal. Patient exhibits appropriate insight and judgement.   ____________________________________________   LABS (all labs ordered are listed, but only abnormal results are displayed)  Labs Reviewed - No data to display ____________________________________________  EKG   ____________________________________________  RADIOLOGY Robinette Haines, personally viewed and evaluated these images (plain radiographs) as part of my medical decision making, as well as reviewing the written report by the radiologist.  CT Head Wo Contrast  Result Date: 08/17/2019 CLINICAL DATA:  Pt states he hit his head on a metal rod at work 2 days ago  and had dizziness for about 5 minutes after- pt states he's been having balance issues, photophobia EXAM: CT HEAD WITHOUT CONTRAST TECHNIQUE: Contiguous axial images were obtained from the base of the skull through the vertex without intravenous contrast. COMPARISON:  None. FINDINGS: Brain: No evidence of acute infarction, hemorrhage, hydrocephalus, extra-axial collection or mass lesion/mass effect. Vascular: No hyperdense vessel or unexpected calcification. Skull: Normal. Negative for fracture or focal lesion. Sinuses/Orbits: Normal globes and orbits. Visualized sinuses are clear. Other: None. IMPRESSION: Normal unenhanced CT scan of the brain. Electronically Signed   By: Lajean Manes M.D.   On: 08/17/2019 14:38    ____________________________________________    PROCEDURES  Procedure(s) performed:    Procedures    Medications - No data  to display   ____________________________________________   INITIAL IMPRESSION / ASSESSMENT AND PLAN / ED COURSE  Pertinent labs & imaging results that were available during my care of the patient were reviewed by me and considered in my medical decision making (see chart for details).  Review of the [place] CSRS was performed in accordance of the [place] prior to dispensing any controlled drugs.   Patient presented to emergency department for evaluation of head injury.  Vital signs and exam are reassuring.  CT head is negative for acute abnormalities.  Education about concussions was provided.  Patient is to follow up with primary care as directed. Patient is given ED precautions to return to the ED for any worsening or new symptoms.  Wesley Daniels was evaluated in Emergency Department on 08/17/2019 for the symptoms described in the history of present illness. He was evaluated in the context of the global COVID-19 pandemic, which necessitated consideration that the patient might be at risk for infection with the SARS-CoV-2 virus that causes COVID-19.  Institutional protocols and algorithms that pertain to the evaluation of patients at risk for COVID-19 are in a state of rapid change based on information released by regulatory bodies including the CDC and federal and state organizations. These policies and algorithms were followed during the patient's care in the ED.   ____________________________________________  FINAL CLINICAL IMPRESSION(S) / ED DIAGNOSES  Final diagnoses:  Concussion without loss of consciousness, initial encounter      NEW MEDICATIONS STARTED DURING THIS VISIT:  ED Discharge Orders    None          This chart was dictated using voice recognition software/Dragon. Despite best efforts to proofread, errors can occur which can change the meaning. Any change was purely unintentional.    Enid Derry, PA-C 08/17/19 1541    Jene Every, MD 08/18/19 [phone]

## 2019-08-17 NOTE — ED Triage Notes (Addendum)
Pt states he hit his head on a metal rod at work 2 days ago and had dizziness for about 5 minutes after- pt states he's been having balance issues, photophobia, and so he googled concussion symptoms and states he wanted to be seen- pt having head ache

## 2019-08-24 DIAGNOSIS — R519 Headache, unspecified: Secondary | ICD-10-CM | POA: Insufficient documentation

## 2019-11-20 NOTE — Progress Notes (Signed)
11/21/2019 8:55 AM   Wesley Daniels Sep 02, 1965 [id number]  Referring provider: Lauro Regulus, MD [address] [facility] [place],  [place] [address] Chief Complaint  Patient presents with  . Hematuria    HPI: Wesley Daniels is a 54 y.o. male who presents today for evaluation and management of gross hematuria.  Last PSA on 1.36 as of 06/23/2017.  CT renal stone study on 06/22/2018 showed no obstructive uropathy or nephrolithiasis. Fat containing inguinal hernias bilaterally.  He had an annual visit with his PCP on 11/14/2019. Patient was doing well. Ischemic cardiomyopathy edema was controlled at baseline. UA from 11/01/19 revealed trace blood and no blood on dipstick.  Today he reports a weak stream and difficulty urinating for the last few months. His PVR is 74 mL. He was placed on Flomax and alfuzosin in the past but noted it made him feel "numb down there".  Denies ejaculation issues. Has good spontaneous erections after shock wave therapy recently.   He has had painless gross hematuria about a month ago. Denies seeing clots.   He is on aspirin and Plavix.   Former smoker with a 32 packed history, quit on 02/06/2014. He used to smoke 1.5 ppd.   PMH: Past Medical History:  Diagnosis Date  . Anxiety   . 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 Asthma    childhood asthma  . Cardiomyopathy (HCC)   . Coronary artery disease   . Diabetes mellitus without complication (HCC)    APIDRA INSULIN PUMP  . GERD (gastroesophageal reflux disease)   . History of coronary angiogram 2003  . Hyperlipidemia   . Hypertension   . Hyperthyroidism   . Myocardial infarction [facility]) 2015   s/p stent to distal RCA 2015  . Panic attack     Surgical History: Past Surgical History:  Procedure Laterality Date  . CARDIAC CATHETERIZATION    . CORONARY ANGIOPLASTY    . SHOULDER ARTHROSCOPY WITH DISTAL CLAVICLE RESECTION Left 08/21/2015   Procedure: SHOULDER  ARTHROSCOPY WITH DISTAL CLAVICLE RESECTION, SUBACROMIAL DECOMPRESSION, BICEPS TENOTOMY;  Surgeon: Deeann Saint, MD;  Location: [facility] ORS;  Service: Orthopedics;  Laterality: Left;  . TONSILLECTOMY      Home Medications:  Allergies as of 11/21/2019      Reactions   Other Other (See Comments)   Other reaction(s): Unknown Pet dander Seasonal allergies Pet dander Seasonal allergies Other reaction(s): Unknown Pet dander Seasonal allergies      Medication List       Accurate as of November 21, 2019 11:59 PM. If you have any questions, ask your nurse or doctor.        STOP taking these medications   gabapentin 400 MG capsule Commonly known as: Neurontin Stopped by: Vanna Scotland, MD     TAKE these medications   aspirin EC 81 MG tablet Take 81 mg by mouth daily.   clopidogrel 75 MG tablet Commonly known as: PLAVIX Take 75 mg by mouth daily.   Enulose 10 GM/15ML Soln Generic drug: lactulose (encephalopathy) Take by mouth.   HYDROcodone-acetaminophen 5-325 MG tablet Commonly known as: Norco Take 1-2 tablets by mouth every 6 (six) hours as needed.   insulin glulisine 100 UNIT/ML injection Commonly known as: APIDRA Use via insulin pump   lisinopril 2.5 MG tablet Commonly known as: ZESTRIL Take 2.5 mg by mouth at bedtime.   metoprolol tartrate 12.5 mg Tabs tablet Commonly known as: LOPRESSOR Take 12.5 mg by mouth at  bedtime.   pravastatin 20 MG tablet Commonly known as: PRAVACHOL Take 20 mg by mouth at bedtime. Take 1 tablet by mouth at bedtime       Allergies:  Allergies  Allergen Reactions  . Other Other (See Comments)    Other reaction(s): Unknown Pet dander Seasonal allergies Pet dander Seasonal allergies Other reaction(s): Unknown Pet dander Seasonal allergies    Family History: No family history on file.  Social History:  reports that he quit smoking about 5 years ago. His smoking use included cigarettes. He has a 32.00 pack-year smoking  history. He has never used smokeless tobacco. He reports that he does not drink alcohol and does not use drugs.   Physical Exam: BP (!) 143/86   Pulse 92   Ht 6' (1.829 m)   Wt 225 lb (102.1 kg)   BMI 30.52 kg/m   Constitutional:  Alert and oriented, No acute distress. HEENT: [place] AT, moist mucus membranes.  Trachea midline, no masses. Cardiovascular: No clubbing, cyanosis, or edema. Respiratory: Normal respiratory effort, no increased work of breathing. GI: Abdomen is soft, nontender, nondistended, no abdominal masses GU: No CVA tenderness Rectal: Normal sphincter tone, 40 g prostate with rubbery lateral lobe, no nodules/tenderness Skin: No rashes, bruises or suspicious lesions. Neurologic: Grossly intact, no focal deficits, moving all 4 extremities. Psychiatric: Normal mood and affect.  Laboratory Data:  Urinalysis Negative and shows trace blood on dipstick but none on microscopic UA.  Pertinent image Results for orders placed or performed in visit on 11/21/19  Microscopic Examination   Urine  Result Value Ref Range   WBC, UA None seen 0 - 5 /hpf   RBC 0-2 0 - 2 /hpf   Epithelial Cells (non renal) 0-10 0 - 10 /hpf   Bacteria, UA None seen None seen/Few  Urinalysis, Complete  Result Value Ref Range   Specific Gravity, UA 1.025 1.005 - 1.030   pH, UA 5.5 5.0 - 7.5   Color, UA Yellow Yellow   Appearance Ur Clear Clear   Leukocytes,UA Negative Negative   Protein,UA Negative Negative/Trace   Glucose, UA Trace (A) Negative   Ketones, UA Negative Negative   RBC, UA Trace (A) Negative   Bilirubin, UA Negative Negative   Urobilinogen, Ur 0.2 0.2 - 1.0 mg/dL   Nitrite, UA Negative Negative   Microscopic Examination See below:   PSA  Result Value Ref Range   Prostate Specific Ag, Serum 3.0 0.0 - 4.0 ng/mL  Bladder Scan (Post Void Residual) in office  Result Value Ref Range   Scan Result 74      Assessment & Plan:    1. Gross hematuria  UA is negative and shows trace  blood on dipstick but none on microscopic.  Given he is high risk in nature and has a history of smoking the patient will undergo a full hematuria work up including a CT urogram and cystoscopy.  Patient agreed.  2. BPH with weak urinary stream Tried Flomax and alfuzosin which yielded minimal results. He did not like these medications. PVR is 74 mL. PSA was 1.36 on 06/23/2017. Will update prostate cancer screening with PSA today. DRE unremarkable. Plan to discuss options including outlet procedures once work up for #1 completed  3. ED Post shock wave therapy with good results Normal spontaneous erections.     [facility] [address], [address] [place], [place] [address] [phone]  I, Theador Hawthorne, am acting as a scribe for Dr. Vanna Scotland.  I have reviewed the above documentation for accuracy and completeness, and I agree with the above.   Vanna Scotland, MD  I spent 45 total minutes on the day of the encounter including pre-visit review of the medical record, face-to-face time with the patient, and post visit ordering of labs/imaging/tests.

## 2019-11-21 ENCOUNTER — Ambulatory Visit: Payer: BC Managed Care – PPO | Admitting: Urology

## 2019-11-21 ENCOUNTER — Other Ambulatory Visit: Payer: Self-pay

## 2019-11-21 ENCOUNTER — Encounter: Payer: Self-pay | Admitting: Urology

## 2019-11-21 VITALS — BP 143/86 | HR 92 | Ht 72.0 in | Wt 225.0 lb

## 2019-11-21 DIAGNOSIS — R3912 Poor urinary stream: Secondary | ICD-10-CM

## 2019-11-21 DIAGNOSIS — R310 Gross hematuria: Secondary | ICD-10-CM | POA: Diagnosis not present

## 2019-11-21 DIAGNOSIS — N401 Enlarged prostate with lower urinary tract symptoms: Secondary | ICD-10-CM | POA: Diagnosis not present

## 2019-11-21 DIAGNOSIS — N5203 Combined arterial insufficiency and corporo-venous occlusive erectile dysfunction: Secondary | ICD-10-CM | POA: Diagnosis not present

## 2019-11-21 LAB — BLADDER SCAN AMB NON-IMAGING: Scan Result: 74

## 2019-11-21 LAB — URINALYSIS, COMPLETE
Bilirubin, UA: NEGATIVE
Ketones, UA: NEGATIVE
Leukocytes,UA: NEGATIVE
Nitrite, UA: NEGATIVE
Protein,UA: NEGATIVE
Specific Gravity, UA: 1.025 (ref 1.005–1.030)
Urobilinogen, Ur: 0.2 mg/dL (ref 0.2–1.0)
pH, UA: 5.5 (ref 5.0–7.5)

## 2019-11-21 LAB — MICROSCOPIC EXAMINATION
Bacteria, UA: NONE SEEN
WBC, UA: NONE SEEN /hpf (ref 0–5)

## 2019-11-21 NOTE — Patient Instructions (Signed)
Cystoscopy Cystoscopy is a procedure that is used to help diagnose and sometimes treat conditions that affect the lower urinary tract. The lower urinary tract includes the bladder and the urethra. The urethra is the tube that drains urine from the bladder. Cystoscopy is done using a thin, tube-shaped instrument with a light and camera at the end (cystoscope). The cystoscope may be hard or flexible, depending on the goal of the procedure. The cystoscope is inserted through the urethra, into the bladder. Cystoscopy may be recommended if you have:  Urinary tract infections that keep coming back.  Blood in the urine (hematuria).  An inability to control when you urinate (urinary incontinence) or an overactive bladder.  Unusual cells found in a urine sample.  A blockage in the urethra, such as a urinary stone.  Painful urination.  An abnormality in the bladder found during an intravenous pyelogram (IVP) or CT scan. Cystoscopy may also be done to remove a sample of tissue to be examined under a microscope (biopsy). What are the risks? Generally, this is a safe procedure. However, problems may occur, including:  Infection.  Bleeding.  What happens during the procedure?  1. You will be given one or more of the following: ? A medicine to numb the area (local anesthetic). 2. The area around the opening of your urethra will be cleaned. 3. The cystoscope will be passed through your urethra into your bladder. 4. Germ-free (sterile) fluid will flow through the cystoscope to fill your bladder. The fluid will stretch your bladder so that your health care provider can clearly examine your bladder walls. 5. Your doctor will look at the urethra and bladder. 6. The cystoscope will be removed The procedure may vary among health care providers  What can I expect after the procedure? After the procedure, it is common to have: 1. Some soreness or pain in your abdomen and urethra. 2. Urinary symptoms.  These include: ? Mild pain or burning when you urinate. Pain should stop within a few minutes after you urinate. This may last for up to 1 week. ? A small amount of blood in your urine for several days. ? Feeling like you need to urinate but producing only a small amount of urine. Follow these instructions at home: General instructions  Return to your normal activities as told by your health care provider.   Do not drive for 24 hours if you were given a sedative during your procedure.  Watch for any blood in your urine. If the amount of blood in your urine increases, call your health care provider.  If a tissue sample was removed for testing (biopsy) during your procedure, it is up to you to get your test results. Ask your health care provider, or the department that is doing the test, when your results will be ready.  Drink enough fluid to keep your urine pale yellow.  Keep all follow-up visits as told by your health care provider. This is important. Contact a health care provider if you:  Have pain that gets worse or does not get better with medicine, especially pain when you urinate.  Have trouble urinating.  Have more blood in your urine. Get help right away if you:  Have blood clots in your urine.  Have abdominal pain.  Have a fever or chills.  Are unable to urinate. Summary  Cystoscopy is a procedure that is used to help diagnose and sometimes treat conditions that affect the lower urinary tract.  Cystoscopy is done using   a thin, tube-shaped instrument with a light and camera at the end.  After the procedure, it is common to have some soreness or pain in your abdomen and urethra.  Watch for any blood in your urine. If the amount of blood in your urine increases, call your health care provider.  If you were prescribed an antibiotic medicine, take it as told by your health care provider. Do not stop taking the antibiotic even if you start to feel better. This  information is not intended to replace advice given to you by your health care provider. Make sure you discuss any questions you have with your health care provider. Document Revised: 03/22/2018 Document Reviewed: 03/22/2018 Elsevier Patient Education  2020 Elsevier Inc.   

## 2019-11-22 ENCOUNTER — Telehealth: Payer: Self-pay | Admitting: *Deleted

## 2019-11-22 LAB — PSA: Prostate Specific Ag, Serum: 3 ng/mL (ref 0.0–4.0)

## 2019-11-22 NOTE — Telephone Encounter (Addendum)
Patient notified, voiced understanding.   ----- Message from Vanna Scotland, MD sent at 11/22/2019  8:10 AM EDT ----- PSA is within normal limits but on the higher end for his age.  Would recommend keep checking this annually.    Vanna Scotland, MD

## 2019-11-28 ENCOUNTER — Ambulatory Visit
Admission: RE | Admit: 2019-11-28 | Discharge: 2019-11-28 | Disposition: A | Discharge status: Home / Self Care | Payer: BC Managed Care – PPO | Source: Ambulatory Visit | Attending: Urology | Admitting: Urology

## 2019-11-28 ENCOUNTER — Other Ambulatory Visit: Payer: Self-pay

## 2019-11-28 DIAGNOSIS — N5203 Combined arterial insufficiency and corporo-venous occlusive erectile dysfunction: Secondary | ICD-10-CM | POA: Diagnosis present

## 2019-11-28 DIAGNOSIS — R310 Gross hematuria: Secondary | ICD-10-CM | POA: Diagnosis present

## 2019-11-28 LAB — POCT I-STAT CREATININE: Creatinine, Ser: 0.9 mg/dL (ref 0.61–1.24)

## 2019-11-28 MED ORDER — IOHEXOL 300 MG/ML  SOLN
125.0000 mL | Freq: Once | INTRAMUSCULAR | Status: AC | PRN
  Administered 2019-11-28: 125 mL via INTRAVENOUS

## 2019-12-12 ENCOUNTER — Other Ambulatory Visit: Payer: Self-pay

## 2019-12-12 ENCOUNTER — Ambulatory Visit: Payer: BC Managed Care – PPO | Admitting: Urology

## 2019-12-12 VITALS — BP 138/80 | HR 88

## 2019-12-12 DIAGNOSIS — R310 Gross hematuria: Secondary | ICD-10-CM

## 2019-12-12 DIAGNOSIS — R3912 Poor urinary stream: Secondary | ICD-10-CM

## 2019-12-12 DIAGNOSIS — N401 Enlarged prostate with lower urinary tract symptoms: Secondary | ICD-10-CM

## 2019-12-12 NOTE — Progress Notes (Signed)
   12/12/19  CC:  Chief Complaint  Patient presents with  . Cysto    HPI: 54 year old male with gross hematuria presents today for cystoscopic evaluation.  In the interim, he underwent CT urogram which was unremarkable.  He did have some mild prostamegaly but otherwise this was unremarkable.  He is on aspirin and Plavix.  He reports that he is doing fairly well in terms of urination today.  He believes his urination improved after after shockwave therapy.  Blood pressure 138/80, pulse 88. NED. A&Ox3.   No respiratory distress   Abd soft, NT, ND Normal phallus with bilateral descended testicles  Cystoscopy Procedure Note  Patient identification was confirmed, informed consent was obtained, and patient was prepped using Betadine solution.  Lidocaine jelly was administered per urethral meatus.     Pre-Procedure: - Inspection reveals a normal caliber ureteral meatus.  Procedure: The flexible cystoscope was introduced without difficulty - No urethral strictures/lesions are present. - Mildly enlarged prostate with bilobar coaptation - Normal bladder neck - Bilateral ureteral orifices identified - Bladder mucosa  reveals no ulcers, tumors, or lesions - No bladder stones -Minimal/mild trabeculation  Retroflexion shows unremarkable   Post-Procedure: - Patient tolerated the procedure well  Assessment/ Plan:  1. Gross hematuria Status post complete evaluation today including cystoscopy and CT urogram  Cystoscopy and CT unremarkable  Follow-up with me if he has any other episodes of gross hematuria - Urinalysis, Complete  2. Benign prostatic hyperplasia with weak urinary stream Symptomatically improved, not currently on any BPH medications  He may be a good candidate for UroLift in the future if he has any other exacerbation of his urinary symptoms based on the size/cystoscopic appearance of his prostate.  He is not sure pursuing this at this time.  He will follow-up  as needed.   Vanna Scotland, MD

## 2019-12-13 LAB — URINALYSIS, COMPLETE
Bilirubin, UA: NEGATIVE
Ketones, UA: NEGATIVE
Leukocytes,UA: NEGATIVE
Nitrite, UA: NEGATIVE
Protein,UA: NEGATIVE
Specific Gravity, UA: 1.02 (ref 1.005–1.030)
Urobilinogen, Ur: 0.2 mg/dL (ref 0.2–1.0)
pH, UA: 5 (ref 5.0–7.5)

## 2019-12-13 LAB — MICROSCOPIC EXAMINATION
Bacteria, UA: NONE SEEN
Epithelial Cells (non renal): NONE SEEN /hpf (ref 0–10)

## 2020-03-05 ENCOUNTER — Encounter: Payer: Self-pay | Admitting: Urology

## 2020-03-05 ENCOUNTER — Ambulatory Visit (INDEPENDENT_AMBULATORY_CARE_PROVIDER_SITE_OTHER): Payer: BC Managed Care – PPO | Admitting: Urology

## 2020-03-05 ENCOUNTER — Other Ambulatory Visit: Payer: Self-pay

## 2020-03-05 VITALS — BP 153/78 | HR 88 | Ht 72.0 in | Wt 233.0 lb

## 2020-03-05 DIAGNOSIS — R3129 Other microscopic hematuria: Secondary | ICD-10-CM | POA: Diagnosis not present

## 2020-03-05 DIAGNOSIS — K4020 Bilateral inguinal hernia, without obstruction or gangrene, not specified as recurrent: Secondary | ICD-10-CM | POA: Diagnosis not present

## 2020-03-05 DIAGNOSIS — R310 Gross hematuria: Secondary | ICD-10-CM

## 2020-03-14 NOTE — Progress Notes (Signed)
03/05/2020 11:36 AM   Wesley Daniels 06-May-1965 [id number]  Referring provider: Lauro Regulus, MD [address] [facility] [place],  [place] [address]  Chief Complaint  Patient presents with  . Hematuria    follow up    HPI: 54 year old male with a personal history of microscopic hematuria status post evaluation who returns today per request of his cardiologist.  He was noted of microscopic hematuria and underwent extensive evaluations including CT urogram as well as cystoscopy in 12/12/2019.  This was essentially unremarkable.  He does have a personal history of nephrolithiasis and cardiac pathology on aspirin and Plavix.  He was last recently seen by his cardiologist at which time incidental persistent microscopic hematuria blood was noted and his cardiologist felt that he should return to urology.  In addition to the above, he reports that he is is aware of the inguinal hernia is found on CT urogram.  He is intermittently bothered by this and would like to see general surgery for evaluation.  He is mostly bothered his right groin exacerbated by heavy lifting and activity as compared to the left.  He is not bothered by his abdominal hernia.   PMH: Past Medical History:  Diagnosis Date  . Anxiety   . 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 Asthma    childhood asthma  . Cardiomyopathy (HCC)   . Coronary artery disease   . Diabetes mellitus without complication (HCC)    APIDRA INSULIN PUMP  . GERD (gastroesophageal reflux disease)   . History of coronary angiogram 2003  . Hyperlipidemia   . Hypertension   . Hyperthyroidism   . Myocardial infarction [facility]) 2015   s/p stent to distal RCA 2015  . Panic attack     Surgical History: Past Surgical History:  Procedure Laterality Date  . CARDIAC CATHETERIZATION    . CORONARY ANGIOPLASTY    . SHOULDER ARTHROSCOPY WITH DISTAL CLAVICLE RESECTION Left 08/21/2015   Procedure: SHOULDER  ARTHROSCOPY WITH DISTAL CLAVICLE RESECTION, SUBACROMIAL DECOMPRESSION, BICEPS TENOTOMY;  Surgeon: Deeann Saint, MD;  Location: [facility] ORS;  Service: Orthopedics;  Laterality: Left;  . TONSILLECTOMY      Home Medications:  Allergies as of 03/05/2020      Reactions   Other Other (See Comments)   Other reaction(s): Unknown Pet dander Seasonal allergies Pet dander Seasonal allergies Other reaction(s): Unknown Pet dander Seasonal allergies      Medication List       Accurate as of March 05, 2020 11:59 PM. If you have any questions, ask your nurse or doctor.        aspirin EC 81 MG tablet Take 81 mg by mouth daily.   clopidogrel 75 MG tablet Commonly known as: PLAVIX Take 75 mg by mouth daily.   Enulose 10 GM/15ML Soln Generic drug: lactulose (encephalopathy) Take by mouth.   insulin aspart 100 UNIT/ML injection Commonly known as: novoLOG Take up to 100 units daily in insulin pump   lisinopril 2.5 MG tablet Commonly known as: ZESTRIL Take 2.5 mg by mouth at bedtime.   metoprolol tartrate 12.5 mg Tabs tablet Commonly known as: LOPRESSOR Take 12.5 mg by mouth at bedtime.   pravastatin 20 MG tablet Commonly known as: PRAVACHOL Take 20 mg by mouth at bedtime. Take 1 tablet by mouth at bedtime       Allergies:  Allergies  Allergen Reactions  . Other Other (See Comments)    Other reaction(s): Unknown Pet dander  Seasonal allergies Pet dander Seasonal allergies Other reaction(s): Unknown Pet dander Seasonal allergies    Family History: No family history on file.  Social History:  reports that he quit smoking about 6 years ago. His smoking use included cigarettes. He has a 32.00 pack-year smoking history. He has never used smokeless tobacco. He reports that he does not drink alcohol and does not use drugs.   Physical Exam: BP (!) 153/78   Pulse 88   Ht 6' (1.829 m)   Wt 233 lb (105.7 kg)   BMI 31.60 kg/m   Constitutional:  Alert and oriented, No  acute distress. HEENT: [place] AT, moist mucus membranes.  Trachea midline, no masses. Cardiovascular: No clubbing, cyanosis, or edema. Respiratory: Normal respiratory effort, no increased work of breathing. Skin: No rashes, bruises or suspicious lesions. Neurologic: Grossly intact, no focal deficits, moving all 4 extremities. Psychiatric: Normal mood and affect.  Laboratory Data: Lab Results  Component Value Date   WBC 8.1 06/22/2018   HGB 14.5 06/22/2018   HCT 45.6 06/22/2018   MCV 93.3 06/22/2018   PLT 229 06/22/2018    Lab Results  Component Value Date   CREATININE 0.90 11/28/2019    Lab Results  Component Value Date   HGBA1C 7.7 (H) 02/06/2014     Assessment & Plan:    1. Microscopic hematuria In the setting of recent negative evaluation, I see no need at this point in time for additional work-up or repeat cystoscopy  I have advised him to follow-up as scheduled and consider repeat cystoscopy in 1 to 2 years with microscopic hematuria persist  Return sooner if he develops gross hematuria any other urinary symptoms.  He is agreeable this plan.  2. Non-recurrent bilateral inguinal hernia without obstruction or gangrene Requesting referral to general surgery, will provide Intermittently symptomatic - Ambulatory referral to General Surgery  F/u as scheduled  Vanna Scotland, MD  [facility] [address] [place], [place] [address] [phone]

## 2020-03-21 ENCOUNTER — Encounter: Payer: Self-pay | Admitting: Urology

## 2020-04-15 DIAGNOSIS — S46019A Strain of muscle(s) and tendon(s) of the rotator cuff of unspecified shoulder, initial encounter: Secondary | ICD-10-CM | POA: Insufficient documentation

## 2020-12-03 ENCOUNTER — Encounter: Payer: Self-pay | Admitting: Internal Medicine

## 2020-12-04 ENCOUNTER — Encounter: Payer: Self-pay | Admitting: Internal Medicine

## 2020-12-04 ENCOUNTER — Encounter
Admission: RE | Disposition: A | Discharge status: Home / Self Care | Payer: Self-pay | Source: Home / Self Care | Attending: Internal Medicine

## 2020-12-04 ENCOUNTER — Ambulatory Visit: Payer: BC Managed Care – PPO | Admitting: Certified Registered Nurse Anesthetist

## 2020-12-04 ENCOUNTER — Other Ambulatory Visit: Payer: Self-pay

## 2020-12-04 ENCOUNTER — Ambulatory Visit
Admission: RE | Admit: 2020-12-04 | Discharge: 2020-12-04 | Disposition: A | Discharge status: Home / Self Care | Payer: BC Managed Care – PPO | Attending: Internal Medicine | Admitting: Internal Medicine

## 2020-12-04 DIAGNOSIS — K5900 Constipation, unspecified: Secondary | ICD-10-CM | POA: Diagnosis not present

## 2020-12-04 DIAGNOSIS — K921 Melena: Secondary | ICD-10-CM | POA: Insufficient documentation

## 2020-12-04 DIAGNOSIS — Z7982 Long term (current) use of aspirin: Secondary | ICD-10-CM | POA: Insufficient documentation

## 2020-12-04 DIAGNOSIS — Z955 Presence of coronary angioplasty implant and graft: Secondary | ICD-10-CM | POA: Insufficient documentation

## 2020-12-04 DIAGNOSIS — K640 First degree hemorrhoids: Secondary | ICD-10-CM | POA: Insufficient documentation

## 2020-12-04 DIAGNOSIS — I252 Old myocardial infarction: Secondary | ICD-10-CM | POA: Diagnosis not present

## 2020-12-04 DIAGNOSIS — Z7902 Long term (current) use of antithrombotics/antiplatelets: Secondary | ICD-10-CM | POA: Insufficient documentation

## 2020-12-04 DIAGNOSIS — Z794 Long term (current) use of insulin: Secondary | ICD-10-CM | POA: Diagnosis not present

## 2020-12-04 HISTORY — PX: COLONOSCOPY WITH PROPOFOL: SHX5780

## 2020-12-04 LAB — GLUCOSE, CAPILLARY: Glucose-Capillary: 94 mg/dL (ref 70–99)

## 2020-12-04 SURGERY — COLONOSCOPY WITH PROPOFOL
Anesthesia: General

## 2020-12-04 MED ORDER — PROPOFOL 500 MG/50ML IV EMUL
INTRAVENOUS | Status: DC | PRN
  Administered 2020-12-04: 150 ug/kg/min via INTRAVENOUS

## 2020-12-04 MED ORDER — PROPOFOL 10 MG/ML IV BOLUS
INTRAVENOUS | Status: DC | PRN
  Administered 2020-12-04: 10 mg via INTRAVENOUS
  Administered 2020-12-04: 70 mg via INTRAVENOUS

## 2020-12-04 MED ORDER — SODIUM CHLORIDE 0.9 % IV SOLN: INTRAVENOUS | Status: DC

## 2020-12-04 MED ORDER — PROPOFOL 500 MG/50ML IV EMUL
INTRAVENOUS | Status: AC
  Filled 2020-12-04: qty 100

## 2020-12-04 NOTE — Op Note (Signed)
[facility] Patient Name: Wesley Daniels Procedure Date: 12/04/2020 8:53 AM MRN: [id number] Account #: [id number] Date of Birth: [birth year] Admit Type: Outpatient Age: 55 Room: [facility] ENDO ROOM 2 Gender: Male Note Status: Finalized Procedure:             Colonoscopy Indications:           Hematochezia, Change in bowel habits Providers:             Boykin Nearing. [name] MD, MD Referring MD:          Marya Amsler. Dareen Piano MD, MD (Referring MD) Medicines:             Propofol per Anesthesia Complications:         No immediate complications. Procedure:             Pre-Anesthesia Assessment:                        - The risks and benefits of the procedure and the                         sedation options and risks were discussed with the                         patient. All questions were answered and informed                         consent was obtained.                        - Patient identification and proposed procedure were                         verified prior to the procedure by the nurse. The                         procedure was verified in the pre-procedure area in                         the procedure room.                        - ASA Grade Assessment: III - A patient with severe                         systemic disease.                        - After reviewing the risks and benefits, the patient                         was deemed in satisfactory condition to undergo the                         procedure.                        After obtaining informed consent, the colonoscope was                         passed under direct vision. Throughout the  procedure,                         the patient's blood pressure, pulse, and oxygen                         saturations were monitored continuously. The                         Colonoscope was introduced through the anus and                         advanced to the the cecum, identified by appendiceal                          orifice and ileocecal valve. The colonoscopy was                         performed without difficulty. The patient tolerated                         the procedure well. The quality of the bowel                         preparation was good. The ileocecal valve, appendiceal                         orifice, and rectum were photographed. Findings:      The perianal and digital rectal examinations were normal. Pertinent       negatives include normal sphincter tone and no palpable rectal lesions.      Non-bleeding internal hemorrhoids were found during retroflexion. The       hemorrhoids were Grade I (internal hemorrhoids that do not prolapse).      The colon (entire examined portion) appeared normal. Impression:            - Non-bleeding internal hemorrhoids.                        - The entire examined colon is normal.                        - No specimens collected. Recommendation:        - Patient has a contact number available for                         emergencies. The signs and symptoms of potential                         delayed complications were discussed with the patient.                         Return to normal activities tomorrow. Written                         discharge instructions were provided to the patient.                        - High fiber diet.                        -  Continue present medications.                        - Repeat colonoscopy in 10 years for screening                         purposes.                        - Return to GI office PRN.                        - The findings and recommendations were discussed with                         the patient. Procedure Code(s):     --- Professional ---                        [phone], Colonoscopy, flexible; diagnostic, including                         collection of specimen(s) by brushing or washing, when                         performed (separate procedure) Diagnosis Code(s):     ---  Professional ---                        R19.4, Change in bowel habit                        K92.1, Melena (includes Hematochezia)                        K64.0, First degree hemorrhoids CPT copyright 2019 American Medical Association. All rights reserved. The codes documented in this report are preliminary and upon coder review may  be revised to meet current compliance requirements. Stanton Kidney MD, MD 12/04/2020 9:16:18 AM This report has been signed electronically. Number of Addenda: 0 Note Initiated On: 12/04/2020 8:53 AM Scope Withdrawal Time: 0 hours 6 minutes 3 seconds  Total Procedure Duration: 0 hours 8 minutes 53 seconds  Estimated Blood Loss:  Estimated blood loss: none.      [facility]

## 2020-12-04 NOTE — Transfer of Care (Signed)
Immediate Anesthesia Transfer of Care Note  Patient: Wesley Daniels  Procedure(s) Performed: COLONOSCOPY WITH PROPOFOL  Patient Location: PACU  Anesthesia Type:General  Level of Consciousness: drowsy  Airway & Oxygen Therapy: Patient Spontanous Breathing and Patient connected to face mask oxygen  Post-op Assessment: Report given to RN and Post -op Vital signs reviewed and stable  Post vital signs: Reviewed and stable  Last Vitals:  Vitals Value Taken Time  BP 94/77 12/04/20 0916  Temp    Pulse 81 12/04/20 0916  Resp 15 12/04/20 0916  SpO2 97 % 12/04/20 0916  Vitals shown include unvalidated device data.  Last Pain:  Vitals:   12/04/20 0916  TempSrc:   PainSc: 0-No pain         Complications: No notable events documented.

## 2020-12-04 NOTE — H&P (Signed)
Outpatient short stay form Pre-procedure 12/04/2020 8:23 AM Wesley Daniels K. Wesley Daniels, M.D.  Primary Physician: Einar Crow, M.D.  Reason for visit:  Hematochezia, change in bowel habits  History of present illness: 55 year old male with a remote history of NSTEMI status post left heart catheterization with stent of the RCA presents for hematochezia.  Patient has intermittent constipation for which he takes lactulose.  Last colonoscopy 2014 by Dr. Trixie Deis was unremarkable.   No current facility-administered medications for this encounter.  Medications Prior to Admission  Medication Sig Dispense Refill Last Dose   aspirin EC 81 MG tablet Take 81 mg by mouth daily.       clopidogrel (PLAVIX) 75 MG tablet Take 75 mg by mouth daily.      ENULOSE 10 GM/15ML SOLN Take by mouth.      insulin aspart (NOVOLOG) 100 UNIT/ML injection Take up to 100 units daily in insulin pump      lisinopril (PRINIVIL,ZESTRIL) 2.5 MG tablet Take 2.5 mg by mouth at bedtime.       metoprolol tartrate (LOPRESSOR) 12.5 mg TABS tablet Take 12.5 mg by mouth at bedtime.       pravastatin (PRAVACHOL) 20 MG tablet Take 20 mg by mouth at bedtime. 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     Physical Exam  Gen: Alert, oriented. Appears stated age.  HEENT: /AT. PERRLA. Lungs: CTA, no wheezes. CV: RR nl S1, S2. Abd: soft, benign, no masses. BS+ Ext: No edema. Pulses 2+    Planned procedures: Proceed with colonoscopy. The patient understands the nature of the planned procedure, indications, risks, alternatives and potential complications including but not limited to bleeding, infection, perforation, damage to internal organs and possible oversedation/side effects from anesthesia. The patient agrees and gives consent to proceed.  Please refer to procedure notes for findings, recommendations and patient disposition/instructions.     Liana Camerer K. Wesley Daniels, M.D. Gastroenterology 12/04/2020  8:23 AM

## 2020-12-04 NOTE — Interval H&P Note (Signed)
History and Physical Interval Note:  12/04/2020 8:24 AM  Wesley Daniels  has presented today for surgery, with the diagnosis of HEMATOCHEZIA CHRONIC CONSTIPATION.  The various methods of treatment have been discussed with the patient and family. After consideration of risks, benefits and other options for treatment, the patient has consented to  Procedure(s) with comments: COLONOSCOPY WITH PROPOFOL (N/A) - IDDM as a surgical intervention.  The patient's history has been reviewed, patient examined, no change in status, stable for surgery.  I have reviewed the patient's chart and labs.  Questions were answered to the patient's satisfaction.     [place], [place]

## 2020-12-04 NOTE — Anesthesia Preprocedure Evaluation (Signed)
Anesthesia Evaluation  Patient identified by MRN, date of birth, ID band Patient awake    Reviewed: Allergy & Precautions, H&P , NPO status , Patient's Chart, lab work & pertinent test results, reviewed documented beta blocker date and time   History of Anesthesia Complications Negative for: history of anesthetic complications  Airway Mallampati: III  TM Distance: >3 FB Neck ROM: full    Dental  (+) Teeth Intact,  Bridge on the top front and bottom:   Pulmonary neg shortness of breath, asthma (as a child) , neg sleep apnea, neg COPD, neg recent URI, former smoker,    Pulmonary exam normal breath sounds clear to auscultation       Cardiovascular Exercise Tolerance: Good hypertension, (-) angina+ CAD, + Past MI and + Cardiac Stents (placed in 2015)  (-) CABG Normal cardiovascular exam+ dysrhythmias (2016) Ventricular Tachycardia and Ventricular Fibrillation (-) Valvular Problems/Murmurs Rhythm:regular Rate:Normal     Neuro/Psych negative neurological ROS  negative psych ROS   GI/Hepatic Neg liver ROS, GERD  Medicated,HEMATOCHEZIA   CHRONIC CONSTIPATION    Endo/Other  diabetes (insulin pump), Type 1, Insulin DependentHyperthyroidism (borderline)   Renal/GU negative Renal ROS  negative genitourinary   Musculoskeletal   Abdominal   Peds  Hematology negative hematology ROS (+)   Anesthesia Other Findings Past Medical History:   Coronary artery disease                                      Hypertension                                                 Arrhythmia                                                     Comment: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, 2014   GERD (gastroesophageal reflux disease)                       Myocardial infarction (HCC)                     2015           Comment:s/p stent to distal RCA 2015   Asthma                                                         Comment:childhood asthma   Anxiety                                                       Panic attack  History of coronary angiogram                   2003         Cardiomyopathy (HCC)                                         Hyperlipidemia                                               Hyperthyroidism                                              Diabetes mellitus without complication (HCC)                   Comment:APIDRA INSULIN PUMP   Reproductive/Obstetrics negative OB ROS                             Anesthesia Physical  Anesthesia Plan  ASA: III  Anesthesia Plan: General   Post-op Pain Management:    Induction:   PONV Risk Score and Plan: TIVA and Treatment may vary due to age or medical condition  Airway Management Planned: Nasal Cannula  Additional Equipment:   Intra-op Plan:   Post-operative Plan:   Informed Consent: I have reviewed the patients History and Physical, chart, labs and discussed the procedure including the risks, benefits and alternatives for the proposed anesthesia with the patient or authorized representative who has indicated his/her understanding and acceptance.     Dental Advisory Given  Plan Discussed with: Anesthesiologist, CRNA and Surgeon  Anesthesia Plan Comments:         Anesthesia Quick Evaluation

## 2020-12-05 ENCOUNTER — Encounter: Payer: Self-pay | Admitting: Internal Medicine

## 2020-12-05 NOTE — Anesthesia Postprocedure Evaluation (Signed)
Anesthesia Post Note  Patient: Wesley Daniels  Procedure(s) Performed: COLONOSCOPY WITH PROPOFOL  Patient location during evaluation: Endoscopy Anesthesia Type: General Level of consciousness: awake and alert Pain management: pain level controlled Vital Signs Assessment: post-procedure vital signs reviewed and stable Respiratory status: spontaneous breathing, nonlabored ventilation and respiratory function stable Cardiovascular status: blood pressure returned to baseline and stable Postop Assessment: no apparent nausea or vomiting Anesthetic complications: no   No notable events documented.   Last Vitals:  Vitals:   12/04/20 0936 12/04/20 0946  BP: 119/84   Pulse: 75 82  Resp: 10 (!) 23  Temp:    SpO2: 100% 100%    Last Pain:  Vitals:   12/05/20 0835  TempSrc:   PainSc: 0-No pain                 Foye Deer

## 2021-03-10 ENCOUNTER — Ambulatory Visit: Payer: Self-pay | Admitting: Physician Assistant

## 2021-03-31 DIAGNOSIS — L439 Lichen planus, unspecified: Secondary | ICD-10-CM | POA: Insufficient documentation

## 2021-04-23 ENCOUNTER — Other Ambulatory Visit: Payer: Self-pay

## 2021-04-23 ENCOUNTER — Ambulatory Visit
Admission: EM | Admit: 2021-04-23 | Discharge: 2021-04-23 | Disposition: A | Discharge status: Home / Self Care | Payer: BC Managed Care – PPO

## 2021-04-23 ENCOUNTER — Observation Stay (HOSPITAL_BASED_OUTPATIENT_CLINIC_OR_DEPARTMENT_OTHER)
Admission: EM | Admit: 2021-04-23 | Discharge: 2021-04-25 | Disposition: A | Discharge status: Home / Self Care | Payer: BC Managed Care – PPO | Attending: Internal Medicine | Admitting: Internal Medicine

## 2021-04-23 ENCOUNTER — Encounter (HOSPITAL_BASED_OUTPATIENT_CLINIC_OR_DEPARTMENT_OTHER): Payer: Self-pay | Admitting: *Deleted

## 2021-04-23 DIAGNOSIS — Z87891 Personal history of nicotine dependence: Secondary | ICD-10-CM | POA: Insufficient documentation

## 2021-04-23 DIAGNOSIS — Z20822 Contact with and (suspected) exposure to covid-19: Secondary | ICD-10-CM | POA: Diagnosis not present

## 2021-04-23 DIAGNOSIS — E119 Type 2 diabetes mellitus without complications: Secondary | ICD-10-CM

## 2021-04-23 DIAGNOSIS — I2510 Atherosclerotic heart disease of native coronary artery without angina pectoris: Secondary | ICD-10-CM | POA: Diagnosis present

## 2021-04-23 DIAGNOSIS — Z7902 Long term (current) use of antithrombotics/antiplatelets: Secondary | ICD-10-CM | POA: Insufficient documentation

## 2021-04-23 DIAGNOSIS — Z79899 Other long term (current) drug therapy: Secondary | ICD-10-CM | POA: Insufficient documentation

## 2021-04-23 DIAGNOSIS — Z7982 Long term (current) use of aspirin: Secondary | ICD-10-CM | POA: Diagnosis not present

## 2021-04-23 DIAGNOSIS — Z794 Long term (current) use of insulin: Secondary | ICD-10-CM | POA: Insufficient documentation

## 2021-04-23 DIAGNOSIS — Z7901 Long term (current) use of anticoagulants: Secondary | ICD-10-CM

## 2021-04-23 DIAGNOSIS — R040 Epistaxis: Principal | ICD-10-CM | POA: Diagnosis present

## 2021-04-23 DIAGNOSIS — D691 Qualitative platelet defects: Secondary | ICD-10-CM | POA: Insufficient documentation

## 2021-04-23 DIAGNOSIS — J3489 Other specified disorders of nose and nasal sinuses: Secondary | ICD-10-CM | POA: Diagnosis present

## 2021-04-23 DIAGNOSIS — I10 Essential (primary) hypertension: Secondary | ICD-10-CM | POA: Diagnosis present

## 2021-04-23 DIAGNOSIS — J45909 Unspecified asthma, uncomplicated: Secondary | ICD-10-CM | POA: Insufficient documentation

## 2021-04-23 DIAGNOSIS — Z955 Presence of coronary angioplasty implant and graft: Secondary | ICD-10-CM | POA: Diagnosis not present

## 2021-04-23 DIAGNOSIS — E785 Hyperlipidemia, unspecified: Secondary | ICD-10-CM | POA: Diagnosis not present

## 2021-04-23 DIAGNOSIS — E1059 Type 1 diabetes mellitus with other circulatory complications: Secondary | ICD-10-CM

## 2021-04-23 LAB — BASIC METABOLIC PANEL
Anion gap: 8 (ref 5–15)
BUN: 21 mg/dL — ABNORMAL HIGH (ref 6–20)
CO2: 21 mmol/L — ABNORMAL LOW (ref 22–32)
Calcium: 9 mg/dL (ref 8.9–10.3)
Chloride: 105 mmol/L (ref 98–111)
Creatinine, Ser: 0.73 mg/dL (ref 0.61–1.24)
GFR, Estimated: >60 mL/min (ref >60)
Glucose, Bld: 111 mg/dL — ABNORMAL HIGH (ref 70–99)
Potassium: 3.9 mmol/L (ref 3.5–5.1)
Sodium: 134 mmol/L — ABNORMAL LOW (ref 135–145)

## 2021-04-23 LAB — CBC WITH DIFFERENTIAL/PLATELET
Abs Immature Granulocytes: 0.02 10*3/uL (ref 0.00–0.07)
Basophils Absolute: 0.1 10*3/uL (ref 0.0–0.1)
Basophils Relative: 1 %
Eosinophils Absolute: 0.3 10*3/uL (ref 0.0–0.5)
Eosinophils Relative: 5 %
HCT: 42.3 % (ref 39.0–52.0)
Hemoglobin: 14.1 g/dL (ref 13.0–17.0)
Immature Granulocytes: 0 %
Lymphocytes Relative: 32 %
Lymphs Abs: 2.1 10*3/uL (ref 0.7–4.0)
MCH: 30.2 pg (ref 26.0–34.0)
MCHC: 33.3 g/dL (ref 30.0–36.0)
MCV: 90.6 fL (ref 80.0–100.0)
Monocytes Absolute: 0.6 10*3/uL (ref 0.1–1.0)
Monocytes Relative: 9 %
Neutro Abs: 3.5 10*3/uL (ref 1.7–7.7)
Neutrophils Relative %: 53 %
Platelets: 229 10*3/uL (ref 150–400)
RBC: 4.67 MIL/uL (ref 4.22–5.81)
RDW: 12.4 % (ref 11.5–15.5)
WBC: 6.6 10*3/uL (ref 4.0–10.5)
nRBC: 0 % (ref 0.0–0.2)

## 2021-04-23 MED ORDER — TRANEXAMIC ACID 1000 MG/10ML IV SOLN
500.0000 mg | Freq: Once | INTRAVENOUS | Status: AC
  Administered 2021-04-23: 500 mg via TOPICAL
  Filled 2021-04-23: qty 10

## 2021-04-23 MED ORDER — ACETAMINOPHEN 325 MG PO TABS
650.0000 mg | ORAL_TABLET | Freq: Four times a day (QID) | ORAL | Status: DC | PRN
  Administered 2021-04-24: 650 mg via ORAL
  Filled 2021-04-23: qty 2

## 2021-04-23 MED ORDER — MORPHINE SULFATE (PF) 4 MG/ML IV SOLN
4.0000 mg | Freq: Once | INTRAVENOUS | Status: AC
  Administered 2021-04-23: 4 mg via INTRAVENOUS
  Filled 2021-04-23: qty 1

## 2021-04-23 MED ORDER — OXYMETAZOLINE HCL 0.05 % NA SOLN
1.0000 | Freq: Once | NASAL | Status: AC
  Administered 2021-04-23: 1 via NASAL
  Filled 2021-04-23: qty 30

## 2021-04-23 MED ORDER — TRANEXAMIC ACID FOR INHALATION
500.0000 mg | Freq: Once | RESPIRATORY_TRACT | Status: AC
  Administered 2021-04-23: 500 mg via RESPIRATORY_TRACT

## 2021-04-23 MED ORDER — METOPROLOL TARTRATE 25 MG PO TABS
25.0000 mg | ORAL_TABLET | Freq: Once | ORAL | Status: AC
  Administered 2021-04-24: 25 mg via ORAL
  Filled 2021-04-23: qty 1

## 2021-04-23 MED ORDER — ACETAMINOPHEN 650 MG RE SUPP: 650.0000 mg | Freq: Four times a day (QID) | RECTAL | Status: DC | PRN

## 2021-04-23 MED ORDER — LORAZEPAM 2 MG/ML IJ SOLN
1.0000 mg | Freq: Once | INTRAMUSCULAR | Status: AC
  Administered 2021-04-23: 1 mg via INTRAVENOUS
  Filled 2021-04-23: qty 1

## 2021-04-23 MED ORDER — METOPROLOL TARTRATE 5 MG/5ML IV SOLN: 10.0000 mg | Freq: Once | INTRAVENOUS | Status: DC

## 2021-04-23 MED ORDER — SODIUM CHLORIDE 0.9 % IV BOLUS
1000.0000 mL | Freq: Once | INTRAVENOUS | Status: AC
  Administered 2021-04-23: 1000 mL via INTRAVENOUS

## 2021-04-23 NOTE — ED Triage Notes (Signed)
Onset today of epistaxis. Pt reports since the onset the bleeding has been constant. Blood is coming out in clots per patient. Pt has not been able to control the bleeding.

## 2021-04-23 NOTE — ED Triage Notes (Signed)
Sent here from [facility] for nosebleed x 3 hrs

## 2021-04-23 NOTE — Discharge Instructions (Signed)
Please go immediately to [facility] to be seen by ENT  Return to ER if you have uncontrolled bleeding, severe pain, fever

## 2021-04-23 NOTE — ED Provider Notes (Signed)
Patient here today for evaluation of nosebleed that has been ongoing for the last 2 hours. He reports he has been unable to stop bleed. He is on clopidogrel and has been taking same for over 7 years. He has not had any recent changes in meds. He reports in the last several months he has been having more frequent nosebleeds but typically none that last over 20 mins. We attempted to stop bleeding with oxymetazoline but were unsuccessful. Patient was directed to report to ED for labs, etc as he is starting to feel lightheaded, he does have a driver with him today.    Tomi Bamberger, PA-C 04/23/21 1438

## 2021-04-23 NOTE — H&P (Signed)
History and Physical    PLEASE NOTE THAT DRAGON DICTATION SOFTWARE WAS USED IN THE CONSTRUCTION OF THIS NOTE.   Wesley Daniels [id number] DOB: [birth year] DOA: 04/23/2021  PCP: Kirk Ruths, MD  Patient coming from: home   I have personally briefly reviewed patient's old medical records in [place]  Chief Complaint: Epistaxis  HPI: Wesley Daniels is a 56 y.o. male with medical history significant for coronary disease status post PCI with stent placement to distal RCA in 2015, hypertension, hyperlipidemia, type 2 diabetes mellitus, who is admitted to [facility] on 04/23/2021 with epistaxis after presenting from home to [facility] ED complaining of bleeding from right nares.   The patient reports 1 day of spontaneous bleeding from the right nares in the absence of any preceding trauma, and refractory to applying pressure at home.  He conveys that he was able to achieve transient hemostasis at home with pressure, but that he experienced multiple episodes of recurrent bleeding with this approach, prompting him to present to [facility] emergency department this evening for further evaluation and management thereof.  Denies any associated chest pain, shortness of breath, palpitations, nausea, vomiting, dizziness, presyncope, syncope.   In the setting of history of coronary artery disease.  She underwent PCI with stent placement to the distal RCA 2015, he reports that he is on chronic dual antiplatelet therapy with daily baby aspirin as well as Plavix, with most recent doses of both these medications occurring on the morning of 04/23/2021.  Otherwise, he denies any use of blood thinners at home, including no formal anticoagulation.  Denies any known history of underlying liver disease, and reports that his baseline alcohol consumption is 1 glass of wine per week, without any recent deviation from his baseline consumption.  Denies any recent melena, hematochezia.  Denies any dysuria or  gross hematuria.    ED Course:  Vital signs in the ED were notable for the following: Afebrile; heart rate 76-85; blood pressure 124/91 -154/94; respiratory rate 14-18, oxygen saturation 96 to 100% on room air.  Labs were notable for the following: BMP notable for the following: BUN 21 compared to most recent prior value of 20 in March 2020, creatinine 0.73 compared to 0.90 in August 2021, glucose 111.  CBC notable for hemoglobin 14.1, associated with normocytic/normochromic findings as well as nonelevated RDW, and platelet count of 229.  Imaging and additional notable ED work-up: EKG showed sinus rhythm with 2 PVCs, heart rate 81, normal intervals, no evidence of T wave or ST changes, including no evidence of ST elevation.  EDP consulted on-call ENT, Dr. Marcelline Deist, who is evaluated the patient in person in the ED this evening, and following Afrin nasal spray, nebulized TXA, and placement of a dual lumen Rhino Rocket in the right naris, has achieved hemostasis.  Subsequently, Dr. Marcelline Deist recommends overnight observation to the hospitalist service for further evaluation management of epistaxis, with Dr. Marcelline Deist to follow-up with the patient in the hospital in the morning.  While in the ED, the following were administered: In addition to the Afrin nasal spray as well as nebulized TXA, patient also received Ativan 1 mg IV x2, morphine 4 mg IV x1, and Lopressor 25 mg p.o. x1.     Review of Systems: As per HPI otherwise 10 point review of systems negative.   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, 2014   Asthma    childhood  asthma   Cardiomyopathy ([place])    Coronary artery disease    Diabetes mellitus without complication ([place])    APIDRA INSULIN PUMP   GERD (gastroesophageal reflux disease)    History of coronary angiogram 2003   Hyperlipidemia    Hypertension    Hyperthyroidism    Myocardial infarction [facility]) 2015   s/p stent to distal  RCA 2015   Panic attack     Past Surgical History:  Procedure Laterality Date   CARDIAC CATHETERIZATION     COLONOSCOPY WITH PROPOFOL N/A 12/04/2020   Procedure: COLONOSCOPY WITH PROPOFOL;  Surgeon: Tol[place], Benay Pike, MD;  Location: [facility] ENDOSCOPY;  Service: Gastroenterology;  Laterality: N/A;  IDDM   CORONARY ANGIOPLASTY     CORONARY STENT PLACEMENT     x1   SHOULDER ARTHROSCOPY WITH DISTAL CLAVICLE RESECTION Left 08/21/2015   Procedure: SHOULDER ARTHROSCOPY WITH DISTAL CLAVICLE RESECTION, SUBACROMIAL DECOMPRESSION, BICEPS TENOTOMY;  Surgeon: Earnestine Leys, MD;  Location: [facility] ORS;  Service: Orthopedics;  Laterality: Left;   TONSILLECTOMY      Social History:  reports that he quit smoking about 7 years ago. His smoking use included cigarettes. He has a 32.00 pack-year smoking history. He has never used smokeless tobacco. He reports current alcohol use of about 5.0 standard drinks per week. He reports that he does not use drugs.   Allergies  Allergen Reactions   Other Other (See Comments)    Other reaction(s): Unknown Pet dander Seasonal allergies Pet dander Seasonal allergies Other reaction(s): Unknown Pet dander Seasonal allergies    History reviewed. No pertinent family history.    Prior to Admission medications   Medication Sig Start Date End Date Taking? Authorizing Provider  aspirin EC 81 MG tablet Take 81 mg by mouth daily.     [provider]  clopidogrel (PLAVIX) 75 MG tablet Take 75 mg by mouth daily.    [provider]  ENULOSE 10 GM/15ML SOLN Take by mouth. 08/15/19   [provider]  insulin aspart (NOVOLOG) 100 UNIT/ML injection Take up to 100 units daily in insulin pump 11/01/19   [provider]  Insulin Lispro-aabc (LYUMJEV) 100 UNIT/ML SOLN Lyumjev U-100 Insulin 100 unit/mL subcutaneous solution  INJECT 100 UNITS SUBCUTANEOUSLY ONCE DAILY VIA INSULIN PUMP 10/08/20   [provider]  lisinopril (PRINIVIL,ZESTRIL)  2.5 MG tablet Take 2.5 mg by mouth at bedtime.     [provider]  metoprolol tartrate (LOPRESSOR) 12.5 mg TABS tablet Take 12.5 mg by mouth at bedtime.     [provider]  pravastatin (PRAVACHOL) 20 MG tablet Take 20 mg by mouth at bedtime. Take 1 tablet by mouth at bedtime    [provider]     Objective    Physical Exam: Vitals:   04/23/21 2000 04/23/21 2030 04/23/21 2130 04/23/21 2234  BP: (!) 137/91 133/89 (!) 154/94 138/81  Pulse: 92 77 86 78  Resp: 14  16 18   Temp:      TempSrc:      SpO2: 97% 97% 99% 96%  Weight:      Height:        General: appears to be stated age; alert, oriented Skin: warm, dry, no rash Head:  AT/[place] Nose: Dual-lumen Rhino Rocket visible extending from the right naris, without evidence of active bleed Mouth:  Oral mucosa membranes appear moist, normal dentition Neck: supple; trachea midline Heart:  RRR; did not appreciate any M/R/G Lungs: CTAB, did not appreciate any wheezes, rales, or rhonchi Abdomen: +  BS; soft, ND, NT Vascular: 2+ pedal pulses b/l; 2+ radial pulses b/l Extremities: no peripheral edema, no muscle wasting Neuro: strength and sensation intact in upper and lower extremities b/l   Labs on Admission: I have personally reviewed following labs and imaging studies  CBC: Recent Labs  Lab 04/23/21 1819  WBC 6.6  NEUTROABS 3.5  HGB 14.1  HCT 42.3  MCV 90.6  PLT [id number]   Basic Metabolic Panel: Recent Labs  Lab 04/23/21 1819  NA 134*  K 3.9  CL 105  CO2 21*  GLUCOSE 111*  BUN 21*  CREATININE 0.73  CALCIUM 9.0   GFR: Estimated Creatinine Clearance: 132.5 mL/min (by C-G formula based on SCr of 0.73 mg/dL). Liver Function Tests: No results for input(s): AST, ALT, ALKPHOS, BILITOT, PROT, ALBUMIN in the last 168 hours. No results for input(s): LIPASE, AMYLASE in the last 168 hours. No results for input(s): AMMONIA in the last 168 hours. Coagulation Profile: No results for input(s): INR,  PROTIME in the last 168 hours. Cardiac Enzymes: No results for input(s): CKTOTAL, CKMB, CKMBINDEX, TROPONINI in the last 168 hours. BNP (last 3 results) No results for input(s): PROBNP in the last 8760 hours. HbA1C: No results for input(s): HGBA1C in the last 72 hours. CBG: No results for input(s): GLUCAP in the last 168 hours. Lipid Profile: No results for input(s): CHOL, HDL, LDLCALC, TRIG, CHOLHDL, LDLDIRECT in the last 72 hours. Thyroid Function Tests: No results for input(s): TSH, T4TOTAL, FREET4, T3FREE, THYROIDAB in the last 72 hours. Anemia Panel: No results for input(s): VITAMINB12, FOLATE, FERRITIN, TIBC, IRON, RETICCTPCT in the last 72 hours. Urine analysis:    Component Value Date/Time   COLORURINE STRAW (A) 06/22/2018 2110   APPEARANCEUR Clear 12/12/2019 1459   LABSPEC 1.004 (L) 06/22/2018 2110   LABSPEC 1.007 02/06/2014 0647   PHURINE 5.0 06/22/2018 2110   GLUCOSEU 2+ (A) 12/12/2019 1459   GLUCOSEU >=500 02/06/2014 0647   HGBUR SMALL (A) 06/22/2018 2110   BILIRUBINUR Negative 12/12/2019 1459   BILIRUBINUR Negative 02/06/2014 0647   KETONESUR NEGATIVE 06/22/2018 2110   PROTEINUR Negative 12/12/2019 1459   PROTEINUR NEGATIVE 06/22/2018 2110   NITRITE Negative 12/12/2019 1459   NITRITE NEGATIVE 06/22/2018 2110   LEUKOCYTESUR Negative 12/12/2019 1459   LEUKOCYTESUR NEGATIVE 06/22/2018 2110   LEUKOCYTESUR Negative 02/06/2014 0647    Radiological Exams on Admission: No results found.   EKG: Independently reviewed, with result as described above.    Assessment/Plan   Principal Problem:   Epistaxis Active Problems:   Type 2 diabetes mellitus without complication (HCC)   Hyperlipidemia   Hypertension    #) Epistaxis: Spontaneous bleed from the right nares over the course of the last day, refractory to holding pressure at home, in the context of dual antiplatelet therapy in the setting of a history of coronary artery disease for which the patient underwent  PCI with stent placement to the distal RCA in 2015.  Hemostasis achieved via ensuing ENT consult and in person evaluation in the ED this evening, including following application of Afrin nose spray, nebulized TXA, and ultimately placement of dual-lumen Rhino Rocket to the right naris.  ENT to formally consult in follow-up with the patient in the hospital in the morning, while recommending interval overnight observation to the hospitalist service, as well as holding of overnight antiplatelet medications.  Maintenance of hemostasis per my ensuing physical exam.  CBC reflects baseline hemoglobin and platelet count.  Otherwise, asymptomatic, and hemodynamically stable.  Plan: ENT consulted, as  above.  Repeat CBC in the morning.  Check INR.  Hold home aspirin and Plavix for now, as above.  Monitor on symmetry.  Type and screen ordered.      #) Generalized anxiety disorder: Documented history of such, although not on any scheduled SSRIs or SNRIs at home, nor any prn anxiety lytics as an outpatient.  Noted to be anxious appearing earlier in the ED course leading up to achievement of hemostasis per the above, as well as following multiple doses of IV Ativan.  Plan: Prn IV Ativan.       #) Type 2 diabetes mellitus: Documented history of such, on insulin pump as an outpatient, appears well controlled, with most recent hemoglobin A1c noted to be 6.8% when checked on 03/04/2021.  Presenting blood sugar noted to be 111.  Plan: I have placed orders for resumption of home insulin pump via home insulin pump order set, associated with Accu-Cheks before every meal and at bedtime as well as a 2 AM on a daily basis.  Repeat BMP in the morning.        #) Hyperlipidemia: documented h/o such. On pravastatin as outpatient.    Plan: continue home statin.          #) Essential Hypertension: documented h/o such, with outpatient antihypertensive regimen including lisinopril, Lopressor.  SBP's in the ED  today: Normotensive after treatment of improved pain control and hemostasis.   Plan: Close monitoring of subsequent BP via routine VS. resume home lisinopril and Lopressor.       DVT prophylaxis: SCD's   Code Status: Full code Family Communication: none Disposition Plan: Per Rounding Team Consults called: Dr. Marcelline Deist of ENT consulted, as further detailed above;  Admission status: Observation; med telemetry   PLEASE NOTE THAT DRAGON DICTATION SOFTWARE WAS USED IN THE CONSTRUCTION OF THIS NOTE.   Martin DO [facility]  From [place]   04/23/2021, 11:50 PM

## 2021-04-23 NOTE — ED Notes (Signed)
ENT provider at [facility]

## 2021-04-23 NOTE — ED Provider Notes (Signed)
°  11:18 PM Patient seen on arrival after transfer.  Now after patient had successful packing by Dr. Marcelline Deist.   [name], MD 04/23/21 [phone]

## 2021-04-23 NOTE — ED Notes (Signed)
Unable to obtain vital signs due to active nose bleed. Provider made aware

## 2021-04-23 NOTE — ED Notes (Signed)
Spoke with [place], Forensic scientist and gave report on pt coming over.

## 2021-04-23 NOTE — Consult Note (Signed)
Reason for Consult:epistaxis Referring Physician: ER MD  Wesley Daniels is an 56 y.o. male.   HPI: 56 year old 12 years status post stent placement on aspirin and Plavix daily with on and off right-sided epistaxis for the past 2 months never seen by physician for this always stopping on its own until earlier today.  Bleeding increased and intensity and amount and the patient was first seen in urgent care.  Attempts were made by placing a long inflatable Merocel sponge.  This did not work and the patient was transferred to another urgent care within the [facility] system where they placed another sponge and attempt to stop the bleeding.  This failed as well and the patient was transferred to [facility] for ENT consultation.  In addition to his lengthy medical history, he has been having some left-sided shooting pains in his chest over the past couple weeks and now is having some increasing chest discomfort.  I have discussed this with the ER physician who is addressing it now.  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, 2014   Asthma    childhood asthma   Cardiomyopathy (HCC)    Coronary artery disease    Diabetes mellitus without complication (HCC)    APIDRA INSULIN PUMP   GERD (gastroesophageal reflux disease)    History of coronary angiogram 2003   Hyperlipidemia    Hypertension    Hyperthyroidism    Myocardial infarction [facility]) 2015   s/p stent to distal RCA 2015   Panic attack     Past Surgical History:  Procedure Laterality Date   CARDIAC CATHETERIZATION     COLONOSCOPY WITH PROPOFOL N/A 12/04/2020   Procedure: COLONOSCOPY WITH PROPOFOL;  Surgeon: Toledo, Boykin Nearing, MD;  Location: [facility] ENDOSCOPY;  Service: Gastroenterology;  Laterality: N/A;  IDDM   CORONARY ANGIOPLASTY     CORONARY STENT PLACEMENT     x1   SHOULDER ARTHROSCOPY WITH DISTAL CLAVICLE RESECTION Left 08/21/2015   Procedure: SHOULDER ARTHROSCOPY WITH DISTAL CLAVICLE  RESECTION, SUBACROMIAL DECOMPRESSION, BICEPS TENOTOMY;  Surgeon: Deeann Saint, MD;  Location: [facility] ORS;  Service: Orthopedics;  Laterality: Left;   TONSILLECTOMY      History reviewed. No pertinent family history.  Social History:  reports that he quit smoking about 7 years ago. His smoking use included cigarettes. He has a 32.00 pack-year smoking history. He has never used smokeless tobacco. He reports current alcohol use of about 5.0 standard drinks per week. He reports that he does not use drugs.  Allergies:  Allergies  Allergen Reactions   Other Other (See Comments)    Other reaction(s): Unknown Pet dander Seasonal allergies Pet dander Seasonal allergies Other reaction(s): Unknown Pet dander Seasonal allergies    Medications: I have reviewed the patient's current medications.  Results for orders placed or performed during the hospital encounter of 04/23/21 (from the past 48 hour(s))  CBC with Differential/Platelet     Status: None   Collection Time: 04/23/21  6:19 PM  Result Value Ref Range   WBC 6.6 4.0 - 10.5 K/uL   RBC 4.67 4.22 - 5.81 MIL/uL   Hemoglobin 14.1 13.0 - 17.0 g/dL   HCT 03.1 59.4 - 58.5 %   MCV 90.6 80.0 - 100.0 fL   MCH 30.2 26.0 - 34.0 pg   MCHC 33.3 30.0 - 36.0 g/dL   RDW 92.9 24.4 - 62.8 %   Platelets 229 150 - 400 K/uL   nRBC  0.0 0.0 - 0.2 %   Neutrophils Relative % 53 %   Neutro Abs 3.5 1.7 - 7.7 K/uL   Lymphocytes Relative 32 %   Lymphs Abs 2.1 0.7 - 4.0 K/uL   Monocytes Relative 9 %   Monocytes Absolute 0.6 0.1 - 1.0 K/uL   Eosinophils Relative 5 %   Eosinophils Absolute 0.3 0.0 - 0.5 K/uL   Basophils Relative 1 %   Basophils Absolute 0.1 0.0 - 0.1 K/uL   Immature Granulocytes 0 %   Abs Immature Granulocytes 0.02 0.00 - 0.07 K/uL    Comment: Performed at [facility], [address] [place], [place] [address]  Basic metabolic panel     Status: Abnormal   Collection Time: 04/23/21  6:19 PM  Result Value Ref Range   Sodium  134 (L) 135 - 145 mmol/L   Potassium 3.9 3.5 - 5.1 mmol/L   Chloride 105 98 - 111 mmol/L   CO2 21 (L) 22 - 32 mmol/L   Glucose, Bld 111 (H) 70 - 99 mg/dL    Comment: Glucose reference range applies only to samples taken after fasting for at least 8 hours.   BUN 21 (H) 6 - 20 mg/dL   Creatinine, Ser 6.54 0.61 - 1.24 mg/dL   Calcium 9.0 8.9 - 65.0 mg/dL   GFR, Estimated >35 >46 mL/min    Comment: (NOTE) Calculated using the CKD-EPI Creatinine Equation (2021)    Anion gap 8 5 - 15    Comment: Performed at [facility], [address] [place], [place] [address]    No results found.  Review of Systems Blood pressure 138/81, pulse 78, temperature 97.8 F (36.6 C), temperature source Oral, resp. rate 18, height 6' (1.829 m), weight 108 kg, SpO2 96 %.  Physical Exam Alert and oriented speaks in complete sentences without distress Pupils equal round and reactive to light with conjugate gaze Pinna normal without mastoid tenderness Normal tongue mobility, normal lips Neck without adenopathy or mass/trachea midline Chest symmetric expansions bilaterally without use of accessory muscles Nerves II through XII intact Face atraumatic without bony step-offs or sinus tenderness External nose unremarkable/active bleeding from the right naris with pack in place  Assessment/Plan:  Epistaxis Platelet dysfunction secondary to medication Coronary artery disease status post stent placement Diabetes mellitus Asthma Anxiety GERD History of cardiac arrhythmia  Given the patient's been packed twice previous to presentation here in [place] and failed both times I elected to place an epistatic catheter filling the posterior balloon with 6 cc of fluid in the anterior balloon with 9 cc of fluid.  The bleeding stopped.  Patient needs to be admitted to a monitored bed while balloon is inflated.  We will leave it inflated for 2 to 3 days before removing it.  If the patient bleeds with the  balloon in place or bleeds once it is removed, then we will consider embolization and/or surgical intervention.  Given his extensive past medical history and vague chest discomfort this evening, I will ask the medicine service to admit to the hospital and ENT will consult.  I will leave the decision regarding his aspirin and Plavix to the medicine service and cardiology.  If safe, the preference would be to stop both for at least the Plavix for a period of time; however, benefits of this must be weighed against the risks.   Rejeana Brock 04/23/2021, 11:15 PM

## 2021-04-23 NOTE — ED Provider Notes (Signed)
[facility] EMERGENCY DEPARTMENT Provider Note   CSN: [id number] Arrival date & time: 04/23/21  1457     History  Chief Complaint  Patient presents with   Epistaxis    Wesley Daniels is a 56 y.o. male hx of CAD s/p stents on plavix, here presenting with nosebleed.  Patient has nosebleed since about 12 noon today.  Patient states that he was unable to control the bleeding.  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  Denies any nasal trauma.  Patient went to urgent care and was sent here after Afrin does not control his nosebleed  The history is provided by the patient.      Home Medications Prior to Admission medications   Medication Sig Start Date End Date Taking? Authorizing Provider  aspirin EC 81 MG tablet Take 81 mg by mouth daily.     [provider]  clopidogrel (PLAVIX) 75 MG tablet Take 75 mg by mouth daily.    [provider]  ENULOSE 10 GM/15ML SOLN Take by mouth. 08/15/19   [provider]  insulin aspart (NOVOLOG) 100 UNIT/ML injection Take up to 100 units daily in insulin pump 11/01/19   [provider]  Insulin Lispro-aabc (LYUMJEV) 100 UNIT/ML SOLN Lyumjev U-100 Insulin 100 unit/mL subcutaneous solution  INJECT 100 UNITS SUBCUTANEOUSLY ONCE DAILY VIA INSULIN PUMP 10/08/20   [provider]  lisinopril (PRINIVIL,ZESTRIL) 2.5 MG tablet Take 2.5 mg by mouth at bedtime.     [provider]  metoprolol tartrate (LOPRESSOR) 12.5 mg TABS tablet Take 12.5 mg by mouth at bedtime.     [provider]  pravastatin (PRAVACHOL) 20 MG tablet Take 20 mg by mouth at bedtime. Take 1 tablet by mouth at bedtime    [provider]      Allergies    Other    Review of Systems   Review of Systems  HENT:  Positive for nosebleeds.   All other systems reviewed and are negative.  Physical Exam Updated Vital Signs BP (!) 137/91    Pulse 92    Temp 97.8  F (36.6 C) (Oral)    Resp 14    Ht 6' (1.829 m)    Wt 108 kg    SpO2 97%    BMI 32.28 kg/m  Physical Exam Vitals and nursing note reviewed.  Constitutional:      Appearance: Normal appearance.  HENT:     Head: Normocephalic.     Nose:     Comments: Bleeding from the right nostril.  He seems to have some active bleeding below the inferior turbinate    Mouth/Throat:     Mouth: Mucous membranes are moist.  Eyes:     Extraocular Movements: Extraocular movements intact.     Pupils: Pupils are equal, round, and reactive to light.  Cardiovascular:     Rate and Rhythm: Normal rate and regular rhythm.     Pulses: Normal pulses.  Pulmonary:     Effort: Pulmonary effort is normal.  Abdominal:     General: Abdomen is flat.  Musculoskeletal:        General: Normal range of motion.     Cervical back: Normal range of motion and neck supple.  Skin:    General: Skin is warm.     Capillary Refill: Capillary refill takes less than 2 seconds.  Neurological:     General: No focal deficit present.  Mental Status: He is alert and oriented to person, place, and time.  Psychiatric:        Mood and Affect: Mood normal.        Behavior: Behavior normal.    ED Results / Procedures / Treatments   Labs (all labs ordered are listed, but only abnormal results are displayed) Labs Reviewed  BASIC METABOLIC PANEL - Abnormal; Notable for the following components:      Result Value   Sodium 134 (*)    CO2 21 (*)    Glucose, Bld 111 (*)    BUN 21 (*)    All other components within normal limits  CBC WITH DIFFERENTIAL/PLATELET    EKG None  Radiology No results found.  Procedures Procedures    CRITICAL CARE Performed by: Richardean Canal   Total critical care time: 30 minutes  Critical care time was exclusive of separately billable procedures and treating other patients.  Critical care was necessary to treat or prevent imminent or life-threatening deterioration.  Critical care was time  spent personally by me on the following activities: development of treatment plan with patient and/or surrogate as well as nursing, discussions with consultants, evaluation of patient's response to treatment, examination of patient, obtaining history from patient or surrogate, ordering and performing treatments and interventions, ordering and review of laboratory studies, ordering and review of radiographic studies, pulse oximetry and re-evaluation of patient's condition.   Medications Ordered in ED Medications  tranexamic acid (CYKLOKAPRON) injection 500 mg (500 mg Topical Given by Other 04/23/21 1808)  oxymetazoline (AFRIN) 0.05 % nasal spray 1 spray (1 spray Each Nare Given by Other 04/23/21 1808)  tranexamic acid (CYKLOKAPRON) 1000 MG/10ML nebulizer solution 500 mg (500 mg Nebulization Given 04/23/21 1838)    ED Course/ Medical Decision Making/ A&P                           Medical Decision Making Wesley Daniels is a 56 y.o. male here with epistaxis.  Patient has bleeding from the right nostril.  We will try TXA and also Afrin and will try packing the nose  6:30 pm I tried to pack the nose with cotton and soaked with TXA and Afrin and gave patient TXA nebs but patient continues to bleed  7 pm I placed 4.5 cm Rhino Rocket to the right nostril.   7:30 pm Patient persistent bleeding.  I now try the medium flexible Rhino Rocket.   8:23 PM Patient has persistent bleeding despite multiple attempts for hemostasis.  Hemoglobin is stable. Patient may need DDAVP which we do not have med [employer].  I discussed case with Dr. Elijah Birk from ENT.  He agreed to see the patient in consult at [facility].  Dr. Rubin Payor accepting   Amount and/or Complexity of Data Reviewed External Data Reviewed: labs. Labs: ordered. Decision-making details documented in ED Course.   Final Clinical Impression(s) / ED Diagnoses Final diagnoses:  None    Rx / DC Orders ED Discharge Orders     None          Charlynne Pander, MD 04/23/21 2024

## 2021-04-24 ENCOUNTER — Encounter (HOSPITAL_COMMUNITY): Payer: Self-pay | Admitting: Internal Medicine

## 2021-04-24 DIAGNOSIS — I10 Essential (primary) hypertension: Secondary | ICD-10-CM | POA: Diagnosis present

## 2021-04-24 DIAGNOSIS — R040 Epistaxis: Secondary | ICD-10-CM | POA: Diagnosis not present

## 2021-04-24 DIAGNOSIS — E785 Hyperlipidemia, unspecified: Secondary | ICD-10-CM | POA: Diagnosis present

## 2021-04-24 DIAGNOSIS — E119 Type 2 diabetes mellitus without complications: Secondary | ICD-10-CM | POA: Diagnosis not present

## 2021-04-24 DIAGNOSIS — I2510 Atherosclerotic heart disease of native coronary artery without angina pectoris: Secondary | ICD-10-CM | POA: Diagnosis present

## 2021-04-24 HISTORY — DX: Atherosclerotic heart disease of native coronary artery without angina pectoris: I25.10

## 2021-04-24 LAB — COMPREHENSIVE METABOLIC PANEL
ALT: 17 U/L (ref 0–44)
AST: 23 U/L (ref 15–41)
Albumin: 3.7 g/dL (ref 3.5–5.0)
Alkaline Phosphatase: 56 U/L (ref 38–126)
Anion gap: 7 (ref 5–15)
BUN: 16 mg/dL (ref 6–20)
CO2: 22 mmol/L (ref 22–32)
Calcium: 8.5 mg/dL — ABNORMAL LOW (ref 8.9–10.3)
Chloride: 108 mmol/L (ref 98–111)
Creatinine, Ser: 0.8 mg/dL (ref 0.61–1.24)
GFR, Estimated: >60 mL/min (ref >60)
Glucose, Bld: 106 mg/dL — ABNORMAL HIGH (ref 70–99)
Potassium: 4.1 mmol/L (ref 3.5–5.1)
Sodium: 137 mmol/L (ref 135–145)
Total Bilirubin: 1 mg/dL (ref 0.3–1.2)
Total Protein: 6.5 g/dL (ref 6.5–8.1)

## 2021-04-24 LAB — RESP PANEL BY RT-PCR (FLU A&B, COVID) ARPGX2
Influenza A by PCR: NEGATIVE
Influenza B by PCR: NEGATIVE
SARS Coronavirus 2 by RT PCR: NEGATIVE

## 2021-04-24 LAB — CBC
HCT: 44.2 % (ref 39.0–52.0)
Hemoglobin: 14.5 g/dL (ref 13.0–17.0)
MCH: 30.6 pg (ref 26.0–34.0)
MCHC: 32.8 g/dL (ref 30.0–36.0)
MCV: 93.2 fL (ref 80.0–100.0)
Platelets: 219 10*3/uL (ref 150–400)
RBC: 4.74 MIL/uL (ref 4.22–5.81)
RDW: 12.3 % (ref 11.5–15.5)
WBC: 9.1 10*3/uL (ref 4.0–10.5)
nRBC: 0 % (ref 0.0–0.2)

## 2021-04-24 LAB — TYPE AND SCREEN
ABO/RH(D): O POS
Antibody Screen: NEGATIVE

## 2021-04-24 LAB — GLUCOSE, CAPILLARY
Glucose-Capillary: 116 mg/dL — ABNORMAL HIGH (ref 70–99)
Glucose-Capillary: 133 mg/dL — ABNORMAL HIGH (ref 70–99)
Glucose-Capillary: 258 mg/dL — ABNORMAL HIGH (ref 70–99)
Glucose-Capillary: 172 mg/dL — ABNORMAL HIGH (ref 70–99)

## 2021-04-24 LAB — PROTIME-INR
INR: 1 (ref 0.8–1.2)
Prothrombin Time: 12.9 seconds (ref 11.4–15.2)

## 2021-04-24 LAB — HIV ANTIBODY (ROUTINE TESTING W REFLEX): HIV Screen 4th Generation wRfx: NONREACTIVE

## 2021-04-24 LAB — ABO/RH: ABO/RH(D): O POS

## 2021-04-24 LAB — CBG MONITORING, ED: Glucose-Capillary: 117 mg/dL — ABNORMAL HIGH (ref 70–99)

## 2021-04-24 LAB — MAGNESIUM: Magnesium: 2 mg/dL (ref 1.7–2.4)

## 2021-04-24 MED ORDER — LORAZEPAM 2 MG/ML IJ SOLN: 1.0000 mg | INTRAMUSCULAR | Status: DC | PRN

## 2021-04-24 MED ORDER — METOPROLOL TARTRATE 12.5 MG HALF TABLET
12.5000 mg | ORAL_TABLET | Freq: Every day | ORAL | Status: DC
  Administered 2021-04-24: 12.5 mg via ORAL
  Filled 2021-04-24: qty 1

## 2021-04-24 MED ORDER — DOXYCYCLINE HYCLATE 100 MG PO TABS
100.0000 mg | ORAL_TABLET | Freq: Two times a day (BID) | ORAL | Status: DC
  Administered 2021-04-24 – 2021-04-25 (×3): 100 mg via ORAL
  Filled 2021-04-24 (×3): qty 1

## 2021-04-24 MED ORDER — LISINOPRIL 5 MG PO TABS
2.5000 mg | ORAL_TABLET | Freq: Every day | ORAL | Status: DC
  Administered 2021-04-24: 2.5 mg via ORAL
  Filled 2021-04-24: qty 1

## 2021-04-24 MED ORDER — LORAZEPAM 2 MG/ML IJ SOLN
1.0000 mg | Freq: Once | INTRAMUSCULAR | Status: AC
  Administered 2021-04-24: 1 mg via INTRAVENOUS
  Filled 2021-04-24: qty 1

## 2021-04-24 MED ORDER — HYDROMORPHONE HCL 1 MG/ML IJ SOLN
0.5000 mg | INTRAMUSCULAR | Status: DC | PRN
  Administered 2021-04-24 – 2021-04-25 (×9): 0.5 mg via INTRAVENOUS
  Filled 2021-04-24 (×6): qty 0.5
  Filled 2021-04-24 (×2): qty 1
  Filled 2021-04-24: qty 0.5

## 2021-04-24 MED ORDER — NALOXONE HCL 0.4 MG/ML IJ SOLN: 0.4000 mg | INTRAMUSCULAR | Status: DC | PRN

## 2021-04-24 MED ORDER — PRAVASTATIN SODIUM 10 MG PO TABS
20.0000 mg | ORAL_TABLET | Freq: Every day | ORAL | Status: DC
  Administered 2021-04-24 (×2): 20 mg via ORAL
  Filled 2021-04-24 (×2): qty 2

## 2021-04-24 MED ORDER — INSULIN PUMP
Freq: Three times a day (TID) | SUBCUTANEOUS | Status: DC
  Filled 2021-04-24: qty 1

## 2021-04-24 NOTE — Plan of Care (Signed)
  Problem: Nutrition: Goal: Adequate nutrition will be maintained Outcome: Progressing   Problem: Pain Managment: Goal: General experience of comfort will improve Outcome: Progressing   Problem: Safety: Goal: Ability to remain free from injury will improve Outcome: Progressing   

## 2021-04-24 NOTE — Progress Notes (Signed)
PROGRESS NOTE        PATIENT DETAILS Name: Wesley Daniels Age: 56 y.o. Sex: male Date of Birth: 21-Jul-1965 Admit Date: 04/23/2021 Admitting Physician Rhetta Mura, DO [id number], Ocie Cornfield, MD  Brief Narrative: Patient is a 56 y.o. male with history of CAD-PCI to RCA 2015-still on dual antiplatelet agents-presenting with intractable epistaxis, evaluated by ENT-and underwent nasal packing by Dr. Marcelline Deist with hemostasis.  Subjective: Lying comfortably in bed-denies any chest pain or shortness of breath.  Right nasal cavity packed-no bleeding since then.  Objective: Vitals: Blood pressure (!) 153/98, pulse 73, temperature 98.2 F (36.8 C), temperature source Oral, resp. rate 18, height 6' (1.829 m), weight 109.5 kg, SpO2 98 %.   Exam: Gen Exam:Alert awake-not in any distress HEENT:atraumatic, normocephalic Chest: B/L clear to auscultation anteriorly CVS:S1S2 regular Abdomen:soft non tender, non distended Extremities:no edema Neurology: Non focal Skin: no rash  Pertinent Labs/Radiology: Recent Labs  Lab 04/24/21 0300  WBC 9.1  HGB 14.5  PLT 219  NA 137  K 4.1  CREATININE 0.80  AST 23  ALT 17  ALKPHOS 56  BILITOT 1.0    Assessment/Plan: * Epistaxis- (present on admission) Stabilizing-nasal cavity packed with epistatic catheter.  Per ENT-if patient has recurrent epistaxis-we will need embolization.  Type 2 diabetes mellitus without complication (HCC) CBG stable on insulin pump.  Hyperlipidemia- (present on admission) Continue statin.  CAD (coronary artery disease)- (present on admission) No anginal symptoms-s/p PCI to proximal RCA 02/07/2014-all antiplatelets on hold.  Will discuss with cardiology to see if we can stop Plavix and just place him on aspirin when epistaxis completely resolved.   Hypertension- (present on admission) BP slightly on the higher side-continue lisinopril/metoprolol.  Watch closely and adjust  accordingly.   Obesity Estimated body mass index is 32.74 kg/m as calculated from the following:   Height as of this encounter: 6' (1.829 m).   Weight as of this encounter: 109.5 kg.   Procedures: None Consults: ENT DVT Prophylaxis: SCD Code Status:Full code  Family Communication: None at bedside   Disposition Plan: Status is: Observation  The patient remains OBS appropriate and will d/c before 2 midnights.  Continue to watch closely for recurrent epistaxis-and await final recommendations from ENT before consideration of discharge.       Diet: Diet Order             Diet regular Room service appropriate? Yes; Fluid consistency: Thin  Diet effective now                     Antimicrobial agents: Anti-infectives (From admission, onward)    None        MEDICATIONS: Scheduled Meds:  insulin pump   Subcutaneous TID WC, HS, 0200   lisinopril  2.5 mg Oral QHS   metoprolol tartrate  12.5 mg Oral QHS   pravastatin  20 mg Oral QHS   Continuous Infusions: PRN Meds:.acetaminophen **OR** acetaminophen, HYDROmorphone (DILAUDID) injection, LORazepam, naLOXone (NARCAN)  injection   I have personally reviewed following labs and imaging studies  LABORATORY DATA: CBC: Recent Labs  Lab 04/23/21 1819 04/24/21 0300  WBC 6.6 9.1  NEUTROABS 3.5  --   HGB 14.1 14.5  HCT 42.3 44.2  MCV 90.6 93.2  PLT 229 [id number]    Basic Metabolic Panel: Recent Labs  Lab 04/23/21 1819 04/24/21 0300  NA 134*  137  K 3.9 4.1  CL 105 108  CO2 21* 22  GLUCOSE 111* 106*  BUN 21* 16  CREATININE 0.73 0.80  CALCIUM 9.0 8.5*  MG  --  2.0    GFR: Estimated Creatinine Clearance: 133.4 mL/min (by C-G formula based on SCr of 0.8 mg/dL).  Liver Function Tests: Recent Labs  Lab 04/24/21 0300  AST 23  ALT 17  ALKPHOS 56  BILITOT 1.0  PROT 6.5  ALBUMIN 3.7   No results for input(s): LIPASE, AMYLASE in the last 168 hours. No results for input(s): AMMONIA in the last 168  hours.  Coagulation Profile: Recent Labs  Lab 04/24/21 0300  INR 1.0    Cardiac Enzymes: No results for input(s): CKTOTAL, CKMB, CKMBINDEX, TROPONINI in the last 168 hours.  BNP (last 3 results) No results for input(s): PROBNP in the last 8760 hours.  Lipid Profile: No results for input(s): CHOL, HDL, LDLCALC, TRIG, CHOLHDL, LDLDIRECT in the last 72 hours.  Thyroid Function Tests: No results for input(s): TSH, T4TOTAL, FREET4, T3FREE, THYROIDAB in the last 72 hours.  Anemia Panel: No results for input(s): VITAMINB12, FOLATE, FERRITIN, TIBC, IRON, RETICCTPCT in the last 72 hours.  Urine analysis:    Component Value Date/Time   COLORURINE STRAW (A) 06/22/2018 2110   APPEARANCEUR Clear 12/12/2019 1459   LABSPEC 1.004 (L) 06/22/2018 2110   LABSPEC 1.007 02/06/2014 0647   PHURINE 5.0 06/22/2018 2110   GLUCOSEU 2+ (A) 12/12/2019 1459   GLUCOSEU >=500 02/06/2014 0647   HGBUR SMALL (A) 06/22/2018 2110   BILIRUBINUR Negative 12/12/2019 1459   BILIRUBINUR Negative 02/06/2014 0647   KETONESUR NEGATIVE 06/22/2018 2110   PROTEINUR Negative 12/12/2019 1459   PROTEINUR NEGATIVE 06/22/2018 2110   NITRITE Negative 12/12/2019 1459   NITRITE NEGATIVE 06/22/2018 2110   LEUKOCYTESUR Negative 12/12/2019 1459   LEUKOCYTESUR NEGATIVE 06/22/2018 2110   LEUKOCYTESUR Negative 02/06/2014 0647    Sepsis Labs: Lactic Acid, Venous No results found for: LATICACIDVEN  MICROBIOLOGY: Recent Results (from the past 240 hour(s))  Resp Panel by RT-PCR (Flu A&B, Covid) Nasopharyngeal Swab     Status: None   Collection Time: 04/24/21  4:13 AM   Specimen: Nasopharyngeal Swab; Nasopharyngeal(NP) swabs in vial transport medium  Result Value Ref Range Status   SARS Coronavirus 2 by RT PCR NEGATIVE NEGATIVE Final    Comment: (NOTE) SARS-CoV-2 target nucleic acids are NOT DETECTED.  The SARS-CoV-2 RNA is generally detectable in upper respiratory specimens during the acute phase of infection. The  lowest concentration of SARS-CoV-2 viral copies this assay can detect is 138 copies/mL. A negative result does not preclude SARS-Cov-2 infection and should not be used as the sole basis for treatment or other patient management decisions. A negative result may occur with  improper specimen collection/handling, submission of specimen other than nasopharyngeal swab, presence of viral mutation(s) within the areas targeted by this assay, and inadequate number of viral copies(<138 copies/mL). A negative result must be combined with clinical observations, patient history, and epidemiological information. The expected result is Negative.  Fact Sheet for Patients:  EntrepreneurPulse.com.au  Fact Sheet for Healthcare Providers:  IncredibleEmployment.be  This test is no t yet approved or cleared by the [place] FDA and  has been authorized for detection and/or diagnosis of SARS-CoV-2 by FDA under an Emergency Use Authorization (EUA). This EUA will remain  in effect (meaning this test can be used) for the duration of the COVID-19 declaration under Section 564(b)(1) of the Act, 21 U.S.C.section 360bbb-3(b)(1), unless  the authorization is terminated  or revoked sooner.       Influenza A by PCR NEGATIVE NEGATIVE Final   Influenza B by PCR NEGATIVE NEGATIVE Final    Comment: (NOTE) The Xpert Xpress SARS-CoV-2/FLU/RSV plus assay is intended as an aid in the diagnosis of influenza from Nasopharyngeal swab specimens and should not be used as a sole basis for treatment. Nasal washings and aspirates are unacceptable for Xpert Xpress SARS-CoV-2/FLU/RSV testing.  Fact Sheet for Patients: EntrepreneurPulse.com.au  Fact Sheet for Healthcare Providers: IncredibleEmployment.be  This test is not yet approved or cleared by the [place] FDA and has been authorized for detection and/or diagnosis of SARS-CoV-2 by FDA under  an Emergency Use Authorization (EUA). This EUA will remain in effect (meaning this test can be used) for the duration of the COVID-19 declaration under Section 564(b)(1) of the Act, 21 U.S.C. section 360bbb-3(b)(1), unless the authorization is terminated or revoked.  Performed at [facility], [place] [address], [place], [place] [address]     RADIOLOGY STUDIES/RESULTS: No results found.   LOS: 0 days   Oren Binet, MD  [facility]    To contact the attending provider between 7A-7P or the covering provider during after hours 7P-7A, please log into the web site [email or web address] and access using universal [place] password for that web site. If you do not have the password, please call the hospital operator.  04/24/2021, 8:07 AM

## 2021-04-24 NOTE — ED Notes (Signed)
Providers messaged regarding Lopressor dose from before this RN assumed care.

## 2021-04-24 NOTE — Assessment & Plan Note (Addendum)
No anginal symptoms-s/p PCI to proximal RCA 02/07/2014.  All antiplatelet agents were held.  Per patient-his cardiologist had him on dual antiplatelet as he was tolerating it, and has been told that in the past if he developed bleeding complications-that he could stop Plavix.  Currently all antiplatelets remain on hold-patient plans to reach out to his cardiologist on Monday-to discuss if he can just go back on aspirin and not be on Plavix.

## 2021-04-24 NOTE — Assessment & Plan Note (Signed)
Continue statin. 

## 2021-04-24 NOTE — ED Notes (Addendum)
3E RN called and asked to send down insulin pump contract

## 2021-04-24 NOTE — Assessment & Plan Note (Signed)
CBG stable on insulin pump.

## 2021-04-24 NOTE — Assessment & Plan Note (Addendum)
BP  stable-continue lisinopril and metoprolol.

## 2021-04-24 NOTE — Assessment & Plan Note (Addendum)
Evaluated by ENT and underwent nasal cavity packing with epistatic catheter.  Bleeding has significantly improved-reevaluated by ENT today-stabilizing-nasal cavity packed with epistatic catheter.  Reevaluated by ENT MD-Dr. Suzanna Obey today-3 cc removed from posterior balloon and 5 cc removed from anterior balloon-significant less pressure in the nasal area.  No active bleeding-only minimal oozing at times.  Per Dr. Soledad Gerlach to discharge-patient will follow in his office this coming Monday for removal of packing.  Per Dr. Leland Her will need to be on Keflex on discharge.

## 2021-04-25 DIAGNOSIS — E119 Type 2 diabetes mellitus without complications: Secondary | ICD-10-CM | POA: Diagnosis not present

## 2021-04-25 DIAGNOSIS — R040 Epistaxis: Secondary | ICD-10-CM | POA: Diagnosis not present

## 2021-04-25 DIAGNOSIS — I10 Essential (primary) hypertension: Secondary | ICD-10-CM | POA: Diagnosis not present

## 2021-04-25 DIAGNOSIS — Z794 Long term (current) use of insulin: Secondary | ICD-10-CM | POA: Diagnosis not present

## 2021-04-25 LAB — CBC
HCT: 42 % (ref 39.0–52.0)
Hemoglobin: 14 g/dL (ref 13.0–17.0)
MCH: 30.8 pg (ref 26.0–34.0)
MCHC: 33.3 g/dL (ref 30.0–36.0)
MCV: 92.3 fL (ref 80.0–100.0)
Platelets: 234 10*3/uL (ref 150–400)
RBC: 4.55 MIL/uL (ref 4.22–5.81)
RDW: 12.3 % (ref 11.5–15.5)
WBC: 11.1 10*3/uL — ABNORMAL HIGH (ref 4.0–10.5)
nRBC: 0 % (ref 0.0–0.2)

## 2021-04-25 LAB — GLUCOSE, CAPILLARY
Glucose-Capillary: 157 mg/dL — ABNORMAL HIGH (ref 70–99)
Glucose-Capillary: 139 mg/dL — ABNORMAL HIGH (ref 70–99)
Glucose-Capillary: 110 mg/dL — ABNORMAL HIGH (ref 70–99)
Glucose-Capillary: 181 mg/dL — ABNORMAL HIGH (ref 70–99)

## 2021-04-25 MED ORDER — CEPHALEXIN 500 MG PO CAPS: 500.0000 mg | ORAL_CAPSULE | Freq: Three times a day (TID) | ORAL | 0 refills | Status: AC

## 2021-04-25 NOTE — Progress Notes (Signed)
Patient ID: Wesley Daniels, male   DOB: [birth year], 56 y.o.   MRN: [id number] Subjective: No bleeding. Pack still in and giving pressure and pain.   Objective: Vital signs in last 24 hours: Temp:  [98.1 F (36.7 C)-98.2 F (36.8 C)] 98.1 F (36.7 C) (01/13 0811) Pulse Rate:  [61-79] 69 (01/13 0811) Resp:  [16-20] 16 (01/13 0811) BP: (140-156)/(80-91) 153/80 (01/13 0811) SpO2:  [98 %-100 %] 99 % (01/13 0811) Weight:  [110.5 kg] 110.5 kg (01/13 0241)  Awake alert. No bleeding and baloon in right side. No bleeding from the NP/. No swelling or erythema of the face. No eye changes  @LABLAST2 (wbc:2,hgb:2,hct:2,plt:2) Recent Labs    04/23/21 1819 04/24/21 0300  NA 134* 137  K 3.9 4.1  CL 105 108  CO2 21* 22  GLUCOSE 111* 106*  BUN 21* 16  CREATININE 0.73 0.80  CALCIUM 9.0 8.5*    Intake/Output Summary (Last 24 hours) at 04/25/2021 04/27/2021 Last data filed at 04/24/2021 2138 Gross per 24 hour  Intake 760 ml  Output --  Net 760 ml     Medications: I have reviewed the patient's current medications.  Assessment/Plan: Epistaxis- he has had balloon since Wednesday night . We talked about removal. He still has a chance of bleeding if remove now instead of 5 days. He wants to go home. He had stopped the plavix on Tuesday. I removed 3 cc from the posterior balloon and 5 from anterior. He was able to breath through the left side of nose now and pressure better. He can be discharged after about 2-3 hours of observation and follow up on Monday with me to pull pack out. Come back to ER if more bleeding. CCall [phone] to get appointment with me on MOnday.  Put patient on Keflex to go home.    LOS: 0 days   [phone] 04/25/2021, 9:42 AM

## 2021-04-25 NOTE — Plan of Care (Signed)

## 2021-04-25 NOTE — Discharge Summary (Signed)
PATIENT DETAILS Name: Wesley Daniels Age: 56 y.o. Sex: male Date of Birth: Nov 21, 1965 MRN: [id number]. Admitting Physician: Angie Fava, DO XBW:IOMBTDHR, Marya Amsler, MD  Admit Date: 04/23/2021 Discharge date: 04/25/2021  Recommendations for Outpatient Follow-up:  Follow-up with ENT MD-Dr. Jearld Fenton this coming Monday. Please ensure follow-up with cardiology.  Admitted From:  Home  Disposition: Home   Home Health: No  Equipment/Devices: None  Discharge Condition: Stable  CODE STATUS: FULL CODE  Diet recommendation:  Diet Order             Diet - low sodium heart healthy           Diet regular Room service appropriate? Yes; Fluid consistency: Thin  Diet effective now                    Brief Summary: See H&P, Labs, Consult and Test reports for all details in brief, patient was admitted for  You may copy/paste interim summary or write brief hospital course depending on length of stay  Brief Hospital Course: * Epistaxis- (present on admission) Evaluated by ENT and underwent nasal cavity packing with epistatic catheter.  Bleeding has significantly improved-reevaluated by ENT today-stabilizing-nasal cavity packed with epistatic catheter.  Reevaluated by ENT MD-Dr. Suzanna Obey today-3 cc removed from posterior balloon and 5 cc removed from anterior balloon-significant less pressure in the nasal area.  No active bleeding-only minimal oozing at times.  Per Dr. Soledad Gerlach to discharge-patient will follow in his office this coming Monday for removal of packing.  Per Dr. Leland Her will need to be on Keflex on discharge.  Type 2 diabetes mellitus without complication (HCC) CBG stable on insulin pump.  Hyperlipidemia- (present on admission) Continue statin.  CAD (coronary artery disease)- (present on admission) No anginal symptoms-s/p PCI to proximal RCA 02/07/2014.  All antiplatelet agents were held.  Per patient-his cardiologist had him on dual antiplatelet as he  was tolerating it, and has been told that in the past if he developed bleeding complications-that he could stop Plavix.  Currently all antiplatelets remain on hold-patient plans to reach out to his cardiologist on Monday-to discuss if he can just go back on aspirin and not be on Plavix.  Hypertension- (present on admission) BP  stable-continue lisinopril and metoprolol.  Obesity: Estimated body mass index is 33.04 kg/m as calculated from the following:   Height as of this encounter: 6' (1.829 m).   Weight as of this encounter: 110.5 kg.    Procedures None  Discharge Diagnoses:  Principal Problem:   Epistaxis Active Problems:   Type 2 diabetes mellitus without complication (HCC)   Hyperlipidemia   CAD (coronary artery disease)   Hypertension   Discharge Instructions:  Activity:  As tolerated  Discharge Instructions     Call MD for:   Complete by: As directed    Recurrent epistaxis.   Diet - low sodium heart healthy   Complete by: As directed    Discharge instructions   Complete by: As directed    1.  Hold aspirin and Plavix until epistaxis has resolved.  2.  Please discuss with your primary cardiologist early next week-if you can just be on aspirin given the fact that you had significant epistaxis requiring hospitalization.  3.  If nasal bleeding reoccurs-please seek immediate medical attention-suggest that you come back to [facility].   Follow with Primary MD  Lauro Regulus, MD in 1-2 weeks  Please get a complete blood count and chemistry  panel checked by your Primary MD at your next visit, and again as instructed by your Primary MD.  Get Medicines reviewed and adjusted: Please take all your medications with you for your next visit with your Primary MD  Laboratory/radiological data: Please request your Primary MD to go over all hospital tests and procedure/radiological results at the follow up, please ask your Primary MD to get all  Hospital records sent to his/her office.  In some cases, they will be blood work, cultures and biopsy results pending at the time of your discharge. Please request that your primary care M.D. follows up on these results.  Also Note the following: If you experience worsening of your admission symptoms, develop shortness of breath, life threatening emergency, suicidal or homicidal thoughts you must seek medical attention immediately by calling 911 or calling your MD immediately  if symptoms less severe.  You must read complete instructions/literature along with all the possible adverse reactions/side effects for all the Medicines you take and that have been prescribed to you. Take any new Medicines after you have completely understood and accpet all the possible adverse reactions/side effects.   Do not drive when taking Pain medications or sleeping medications (Benzodaizepines)  Do not take more than prescribed Pain, Sleep and Anxiety Medications. It is not advisable to combine anxiety,sleep and pain medications without talking with your primary care practitioner  Special Instructions: If you have smoked or chewed Tobacco  in the last 2 yrs please stop smoking, stop any regular Alcohol  and or any Recreational drug use.  Wear Seat belts while driving.  Please note: You were cared for by a hospitalist during your hospital stay. Once you are discharged, your primary care physician will handle any further medical issues. Please note that NO REFILLS for any discharge medications will be authorized once you are discharged, as it is imperative that you return to your primary care physician (or establish a relationship with a primary care physician if you do not have one) for your post hospital discharge needs so that they can reassess your need for medications and monitor your lab values.   Increase activity slowly   Complete by: As directed       Allergies as of 04/25/2021       Reactions   Other  Other (See Comments)   Other reaction(s): Unknown Pet dander Seasonal allergies Pet dander Seasonal allergies Other reaction(s): Unknown Pet dander Seasonal allergies        Medication List     STOP taking these medications    aspirin EC 81 MG tablet   clopidogrel 75 MG tablet Commonly known as: PLAVIX       TAKE these medications    cephALEXin 500 MG capsule Commonly known as: Keflex Take 1 capsule (500 mg total) by mouth 3 (three) times daily for 5 days.   Enulose 10 GM/15ML Soln Generic drug: lactulose (encephalopathy) Take by mouth.   fluocinonide ointment 0.05 % Commonly known as: LIDEX Apply topically 2 (two) times daily.   insulin aspart 100 UNIT/ML injection Commonly known as: novoLOG Take up to 100 units daily in insulin pump   lisinopril 2.5 MG tablet Commonly known as: ZESTRIL Take 2.5 mg by mouth at bedtime.   Lyumjev 100 UNIT/ML Soln Generic drug: Insulin Lispro-aabc Inject 100 Units into the skin daily.   metoprolol tartrate 12.5 mg Tabs tablet Commonly known as: LOPRESSOR Take 12.5 mg by mouth at bedtime.   pravastatin 20 MG tablet Commonly known as: PRAVACHOL  Take 20 mg by mouth at bedtime. Take 1 tablet by mouth at bedtime        Follow-up Information     Lauro Regulus, MD. Schedule an appointment as soon as possible for a visit in 1 week(s).   Specialty: Internal Medicine Contact information: [address] [facility] [place] [address] [phone]         Suzanna Obey Follow up.   Why: Call ENT office at [phone] on 1/16 (Monday) for appointment with Dr. Suzanna Obey.               Allergies  Allergen Reactions   Other Other (See Comments)    Other reaction(s): Unknown Pet dander Seasonal allergies Pet dander Seasonal allergies Other reaction(s): Unknown Pet dander Seasonal allergies      Consultations:   ENT   Other Procedures/Studies: No results  found.   TODAY-DAY OF DISCHARGE:  Subjective:   Harpal Summerson today has no headache,no chest abdominal pain,no new weakness tingling or numbness, feels much better wants to go home today.   Objective:   Blood pressure (!) 153/80, pulse 69, temperature 98.1 F (36.7 C), temperature source Oral, resp. rate 16, height 6' (1.829 m), weight 110.5 kg, SpO2 99 %.  Intake/Output Summary (Last 24 hours) at 04/25/2021 1430 Last data filed at 04/24/2021 2138 Gross per 24 hour  Intake 480 ml  Output --  Net 480 ml   Filed Weights   04/23/21 1518 04/24/21 0500 04/25/21 0241  Weight: 108 kg 109.5 kg 110.5 kg    Exam: Awake Alert, Oriented *3, No new F.N deficits, Normal affect [place].AT,PERRAL Supple Neck,No JVD, No cervical lymphadenopathy appriciated.  Symmetrical Chest wall movement, Good air movement bilaterally, CTAB RRR,No Gallops,Rubs or new Murmurs, No Parasternal Heave +ve B.Sounds, Abd Soft, Non tender, No organomegaly appriciated, No rebound -guarding or rigidity. No Cyanosis, Clubbing or edema, No new Rash or bruise   PERTINENT RADIOLOGIC STUDIES: No results found.   PERTINENT LAB RESULTS: CBC: Recent Labs    04/24/21 0300 04/25/21 0111  WBC 9.1 11.1*  HGB 14.5 14.0  HCT 44.2 42.0  PLT 219 234   CMET CMP     Component Value Date/Time   NA 137 04/24/2021 0300   NA 141 04/05/2014 1839   K 4.1 04/24/2021 0300   K 4.1 04/05/2014 1839   CL 108 04/24/2021 0300   CL 106 04/05/2014 1839   CO2 22 04/24/2021 0300   CO2 28 04/05/2014 1839   GLUCOSE 106 (H) 04/24/2021 0300   GLUCOSE 125 (H) 04/05/2014 1839   BUN 16 04/24/2021 0300   BUN 17 04/05/2014 1839   CREATININE 0.80 04/24/2021 0300   CREATININE 0.86 04/05/2014 1839   CALCIUM 8.5 (L) 04/24/2021 0300   CALCIUM 9.1 04/05/2014 1839   PROT 6.5 04/24/2021 0300   ALBUMIN 3.7 04/24/2021 0300   AST 23 04/24/2021 0300   ALT 17 04/24/2021 0300   ALKPHOS 56 04/24/2021 0300   BILITOT 1.0 04/24/2021 0300   GFRNONAA  >60 04/24/2021 0300   GFRNONAA >60 04/05/2014 1839   GFRAA >60 06/22/2018 2110   GFRAA >60 04/05/2014 1839    GFR Estimated Creatinine Clearance: 134 mL/min (by C-G formula based on SCr of 0.8 mg/dL). No results for input(s): LIPASE, AMYLASE in the last 72 hours. No results for input(s): CKTOTAL, CKMB, CKMBINDEX, TROPONINI in the last 72 hours. Invalid input(s): POCBNP No results for input(s): DDIMER in the last 72 hours. No results for  input(s): HGBA1C in the last 72 hours. No results for input(s): CHOL, HDL, LDLCALC, TRIG, CHOLHDL, LDLDIRECT in the last 72 hours. No results for input(s): TSH, T4TOTAL, T3FREE, THYROIDAB in the last 72 hours.  Invalid input(s): FREET3 No results for input(s): VITAMINB12, FOLATE, FERRITIN, TIBC, IRON, RETICCTPCT in the last 72 hours. Coags: Recent Labs    04/24/21 0300  INR 1.0   Microbiology: Recent Results (from the past 240 hour(s))  Resp Panel by RT-PCR (Flu A&B, Covid) Nasopharyngeal Swab     Status: None   Collection Time: 04/24/21  4:13 AM   Specimen: Nasopharyngeal Swab; Nasopharyngeal(NP) swabs in vial transport medium  Result Value Ref Range Status   SARS Coronavirus 2 by RT PCR NEGATIVE NEGATIVE Final    Comment: (NOTE) SARS-CoV-2 target nucleic acids are NOT DETECTED.  The SARS-CoV-2 RNA is generally detectable in upper respiratory specimens during the acute phase of infection. The lowest concentration of SARS-CoV-2 viral copies this assay can detect is 138 copies/mL. A negative result does not preclude SARS-Cov-2 infection and should not be used as the sole basis for treatment or other patient management decisions. A negative result may occur with  improper specimen collection/handling, submission of specimen other than nasopharyngeal swab, presence of viral mutation(s) within the areas targeted by this assay, and inadequate number of viral copies(<138 copies/mL). A negative result must be combined with clinical observations,  patient history, and epidemiological information. The expected result is Negative.  Fact Sheet for Patients:  BloggerCourse.com  Fact Sheet for Healthcare Providers:  SeriousBroker.it  This test is no t yet approved or cleared by the [place] FDA and  has been authorized for detection and/or diagnosis of SARS-CoV-2 by FDA under an Emergency Use Authorization (EUA). This EUA will remain  in effect (meaning this test can be used) for the duration of the COVID-19 declaration under Section 564(b)(1) of the Act, 21 U.S.C.section 360bbb-3(b)(1), unless the authorization is terminated  or revoked sooner.       Influenza A by PCR NEGATIVE NEGATIVE Final   Influenza B by PCR NEGATIVE NEGATIVE Final    Comment: (NOTE) The Xpert Xpress SARS-CoV-2/FLU/RSV plus assay is intended as an aid in the diagnosis of influenza from Nasopharyngeal swab specimens and should not be used as a sole basis for treatment. Nasal washings and aspirates are unacceptable for Xpert Xpress SARS-CoV-2/FLU/RSV testing.  Fact Sheet for Patients: BloggerCourse.com  Fact Sheet for Healthcare Providers: SeriousBroker.it  This test is not yet approved or cleared by the [place] FDA and has been authorized for detection and/or diagnosis of SARS-CoV-2 by FDA under an Emergency Use Authorization (EUA). This EUA will remain in effect (meaning this test can be used) for the duration of the COVID-19 declaration under Section 564(b)(1) of the Act, 21 U.S.C. section 360bbb-3(b)(1), unless the authorization is terminated or revoked.  Performed at [facility], [address] [place], [place] [address]     FURTHER DISCHARGE INSTRUCTIONS:  Get Medicines reviewed and adjusted: Please take all your medications with you for your next visit with your Primary MD  Laboratory/radiological data: Please request  your Primary MD to go over all hospital tests and procedure/radiological results at the follow up, please ask your Primary MD to get all Hospital records sent to his/her office.  In some cases, they will be blood work, cultures and biopsy results pending at the time of your discharge. Please request that your primary care M.D. goes through all the records of your hospital data  and follows up on these results.  Also Note the following: If you experience worsening of your admission symptoms, develop shortness of breath, life threatening emergency, suicidal or homicidal thoughts you must seek medical attention immediately by calling 911 or calling your MD immediately  if symptoms less severe.  You must read complete instructions/literature along with all the possible adverse reactions/side effects for all the Medicines you take and that have been prescribed to you. Take any new Medicines after you have completely understood and accpet all the possible adverse reactions/side effects.   Do not drive when taking Pain medications or sleeping medications (Benzodaizepines)  Do not take more than prescribed Pain, Sleep and Anxiety Medications. It is not advisable to combine anxiety,sleep and pain medications without talking with your primary care practitioner  Special Instructions: If you have smoked or chewed Tobacco  in the last 2 yrs please stop smoking, stop any regular Alcohol  and or any Recreational drug use.  Wear Seat belts while driving.  Please note: You were cared for by a hospitalist during your hospital stay. Once you are discharged, your primary care physician will handle any further medical issues. Please note that NO REFILLS for any discharge medications will be authorized once you are discharged, as it is imperative that you return to your primary care physician (or establish a relationship with a primary care physician if you do not have one) for your post hospital discharge needs so that  they can reassess your need for medications and monitor your lab values.  Total Time spent coordinating discharge including counseling, education and face to face time equals 35 minutes.  SignedJeoffrey Massed 04/25/2021 2:30 PM

## 2021-04-25 NOTE — Progress Notes (Signed)
Patient ID: Wesley Daniels, male   DOB: [birth year], 56 y.o.   MRN: [id number]  He was bleeding slowly so put 3 cc back into the anterior balloon. He will see how this does to control. If slows then D/C to home would be ok.

## 2021-04-28 ENCOUNTER — Other Ambulatory Visit: Payer: Self-pay

## 2021-04-28 ENCOUNTER — Encounter (INDEPENDENT_AMBULATORY_CARE_PROVIDER_SITE_OTHER): Payer: Self-pay | Admitting: Otolaryngology

## 2021-04-28 ENCOUNTER — Ambulatory Visit (INDEPENDENT_AMBULATORY_CARE_PROVIDER_SITE_OTHER): Payer: BC Managed Care – PPO | Admitting: Otolaryngology

## 2021-04-28 VITALS — BP 126/77 | Temp 78.0°F | Ht 74.0 in | Wt 235.0 lb

## 2021-04-28 DIAGNOSIS — R040 Epistaxis: Secondary | ICD-10-CM | POA: Diagnosis not present

## 2021-04-28 NOTE — Progress Notes (Signed)
Subjective:  Doing well .no bleeding. No pain  Objective: Vital signs in last 24 hours: Normal   Balloon deflated and no bleeding. Some escar in superior right side. No facial swelling.   @LABLAST2 (wbc:2,hgb:2,hct:2,plt:2) No results for input(s): NA, K, CL, CO2, GLUCOSE, BUN, CREATININE, CALCIUM in the last 72 hours. @IOBRIEF @   Medications: I have reviewed the patient's current medications.  Assessment/Plan: Epistaxis- no bleeding. Pack removed. No heavy lifting. Saline irrigation BID. F/u as needed  @RRHLOS @  04/28/2021, 11:09 AM

## 2021-05-13 DIAGNOSIS — J3489 Other specified disorders of nose and nasal sinuses: Secondary | ICD-10-CM | POA: Diagnosis present

## 2021-06-09 DIAGNOSIS — R0981 Nasal congestion: Secondary | ICD-10-CM | POA: Insufficient documentation

## 2021-06-25 DIAGNOSIS — E66811 Obesity, class 1: Secondary | ICD-10-CM | POA: Insufficient documentation

## 2021-07-07 DIAGNOSIS — Z9109 Other allergy status, other than to drugs and biological substances: Secondary | ICD-10-CM | POA: Insufficient documentation

## 2022-04-03 IMAGING — CT CT ABD-PEL WO/W CM
3 of 12 series · 12 of 46 positions shown, 18 images · IV contrast (omnipaque)
Comparison: Stone study 06/22/2018

CLINICAL DATA: Hematuria 6 weeks ago.

EXAM:
CT ABDOMEN AND PELVIS WITHOUT AND WITH CONTRAST
TECHNIQUE: Multidetector CT imaging of the abdomen and pelvis was performed
following the standard protocol before and following the bolus
administration of intravenous contrast.
CONTRAST:  125mL OMNIPAQUE IOHEXOL 300 MG/ML  SOLN

[Series 4: axial post · axial · 0.82mm/px · z∈[-436,-301]mm · 3 of 95 slices shown]
[im 14/95  soft-tissue]
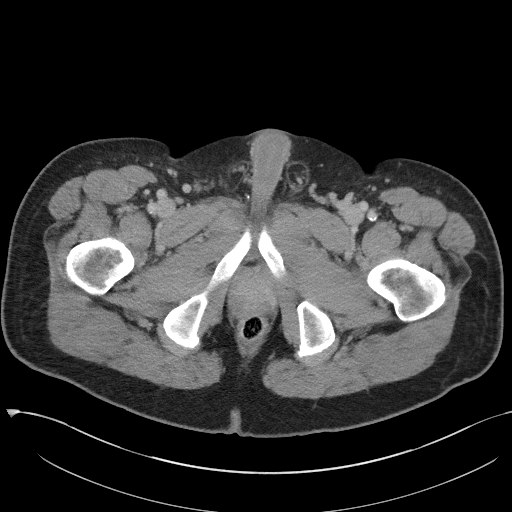
[im 27/95  soft-tissue]
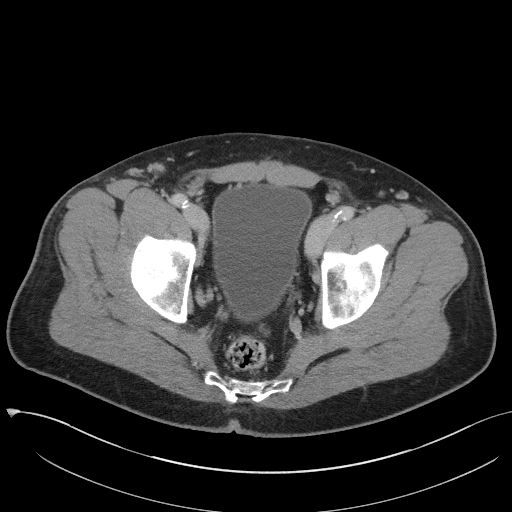
[im 41/95  soft-tissue]
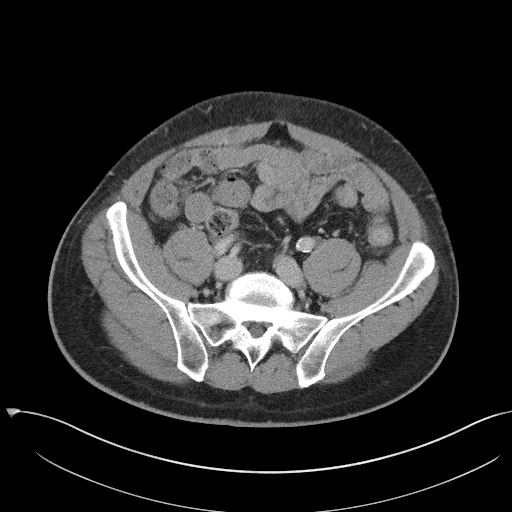

[Series 10: coronal post · coronal · 0.83mm/px · 2 of 102 slices shown, 3 images]
[im 34/102  soft-tissue]
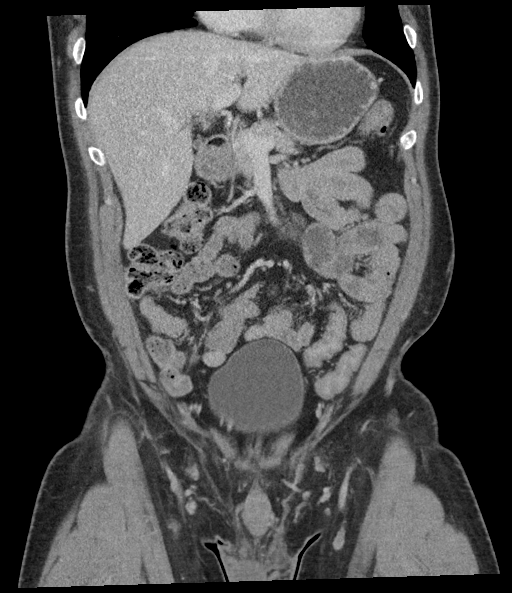
[im 34/102  bone]
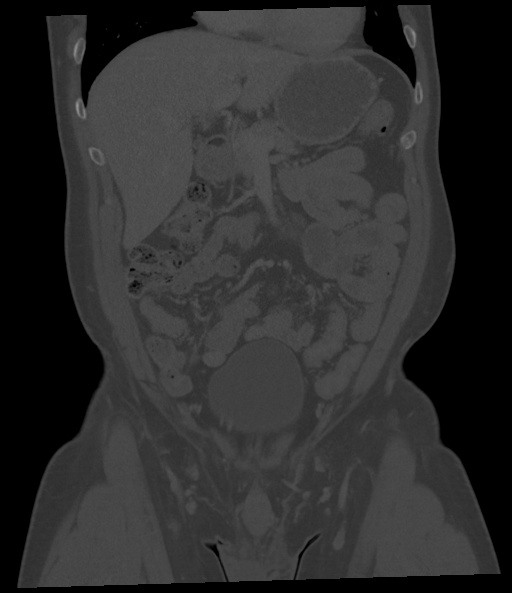
[im 68/102  soft-tissue]
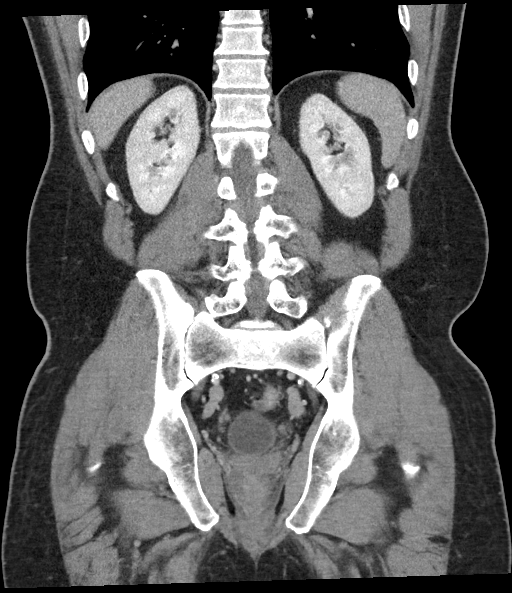

[Series 13: axial delay · axial · delayed · 0.80mm/px · z∈[-472,-82]mm · 7 of 105 slices shown, 12 images]
[im 14/105  soft-tissue]
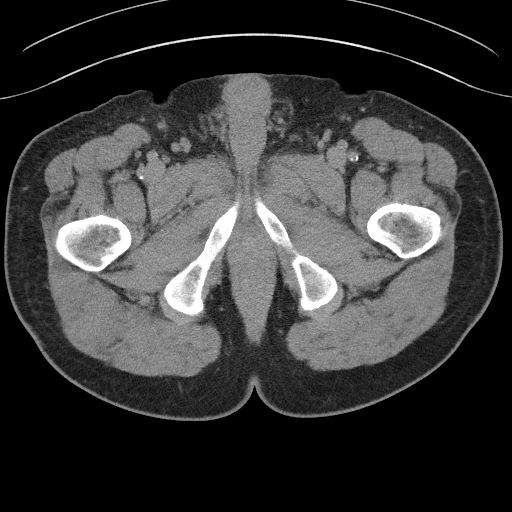
[im 14/105  bone]
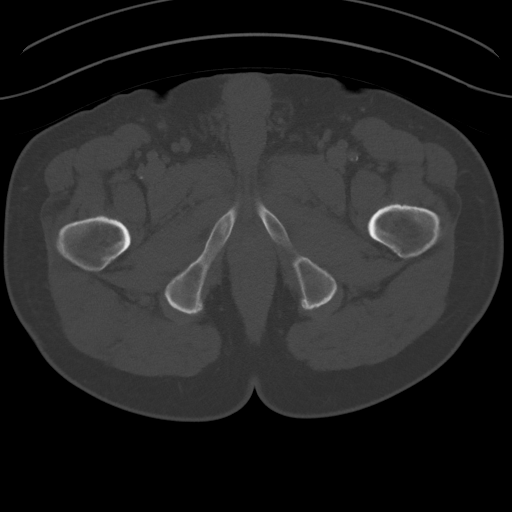
[im 27/105  soft-tissue]
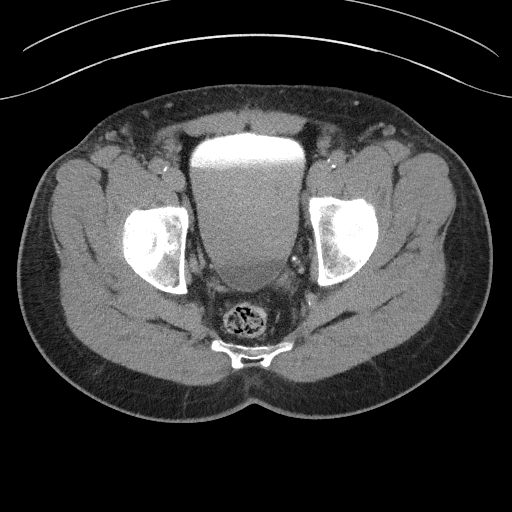
[im 40/105  soft-tissue]
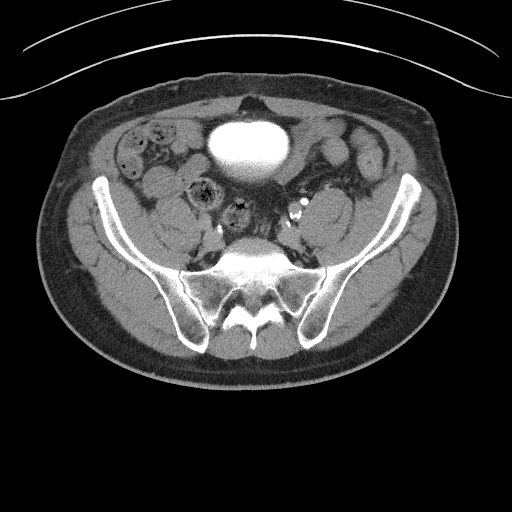
[im 53/105  soft-tissue]
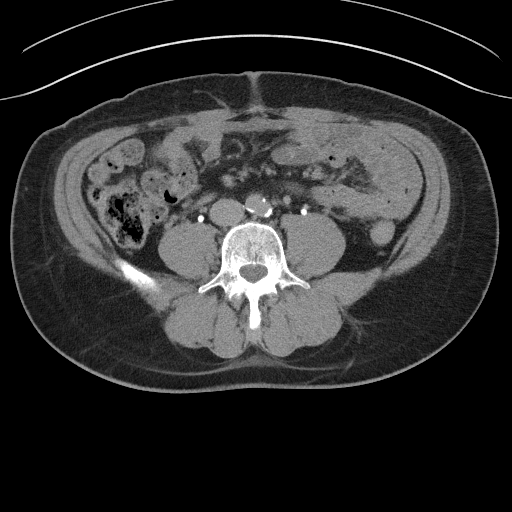
[im 53/105  lung]
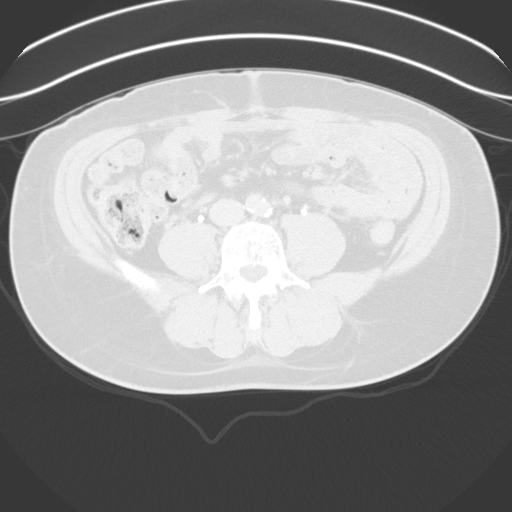
[im 66/105  soft-tissue]
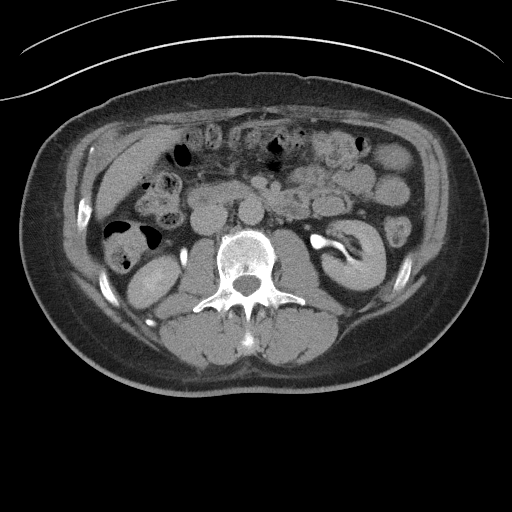
[im 66/105  lung]
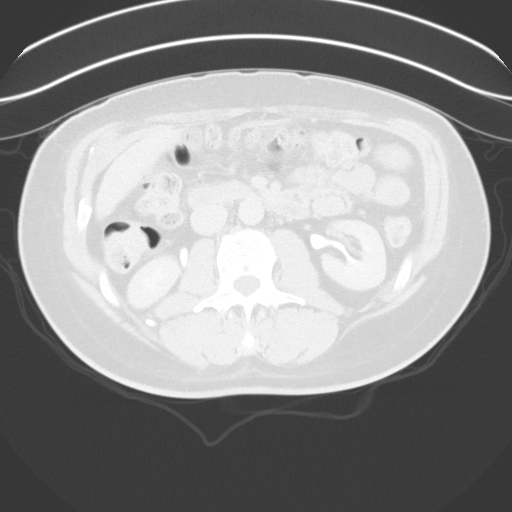
[im 79/105  soft-tissue]
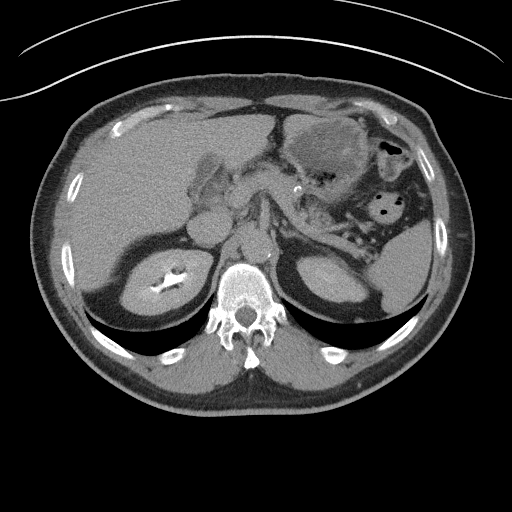
[im 79/105  lung]
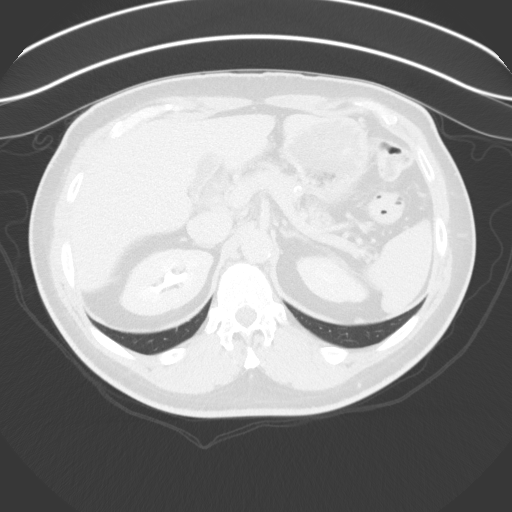
[im 92/105  soft-tissue]
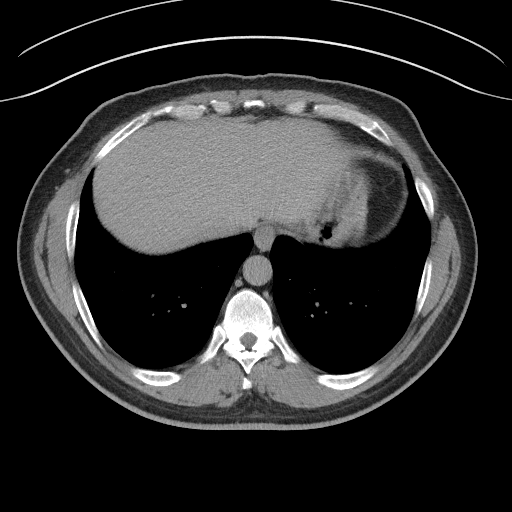
[im 92/105  lung]
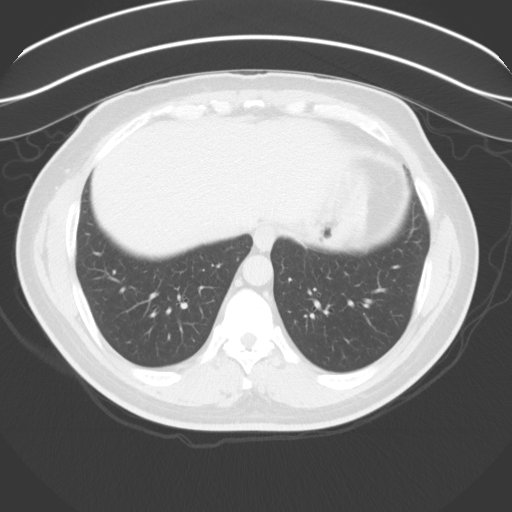

[12 of 46 positions shown; findings below may reference images not displayed]

FINDINGS: Lower chest: Clear lung bases. Normal heart size without pericardial
or pleural effusion.

Hepatobiliary: Normal liver. Normal gallbladder, without biliary
ductal dilatation.

Pancreas: Suspect developmental absence of the pancreatic tail. No
duct dilatation or acute inflammation.

Spleen: Normal in size, without focal abnormality.

Adrenals/Urinary Tract: Normal adrenal glands. No renal calculi or
hydronephrosis. No hydroureter or ureteric calculi. No bladder
calculi. No renal mass on post-contrast images. Moderate renal
collecting system opacification on delayed images. The distal right
ureter is not well opacified on delayed images. No filling defects
in the opacified collecting systems or ureters.

No enhancing bladder mass. Large amount of non-opacified urine
within the bladder.

Stomach/Bowel: Normal stomach, without wall thickening. Normal
colon, appendix, and terminal ileum. Normal small bowel.

Vascular/Lymphatic: Aortic atherosclerosis. No abdominopelvic
adenopathy.

Reproductive: Mild prostatomegaly.

Other: No significant free fluid. Small right larger than left fat
containing inguinal hernias. Fat containing periumbilical tiny
ventral wall hernia.

Musculoskeletal: L3-4 and L4-5 disc bulge.
IMPRESSION: 1. No acute process or explanation for hematuria.
2. Mild prostatomegaly.
3. Aortic Atherosclerosis ([id number]).

## 2022-09-01 DIAGNOSIS — I493 Ventricular premature depolarization: Secondary | ICD-10-CM | POA: Insufficient documentation

## 2023-01-20 ENCOUNTER — Ambulatory Visit: Payer: Self-pay | Admitting: Urology

## 2023-01-21 ENCOUNTER — Encounter: Payer: Self-pay | Admitting: Urology

## 2023-01-21 ENCOUNTER — Ambulatory Visit: Payer: Self-pay | Admitting: Urology

## 2023-01-21 VITALS — BP 153/88 | HR 71 | Ht 74.0 in | Wt 238.1 lb

## 2023-01-21 DIAGNOSIS — R972 Elevated prostate specific antigen [PSA]: Secondary | ICD-10-CM | POA: Diagnosis not present

## 2023-01-21 LAB — URINALYSIS, COMPLETE
Bilirubin, UA: NEGATIVE
Glucose, UA: NEGATIVE
Ketones, UA: NEGATIVE
Leukocytes,UA: NEGATIVE
Nitrite, UA: NEGATIVE
Protein,UA: NEGATIVE
Specific Gravity, UA: 1.015 (ref 1.005–1.030)
Urobilinogen, Ur: 0.2 mg/dL (ref 0.2–1.0)
pH, UA: 5.5 (ref 5.0–7.5)

## 2023-01-21 LAB — MICROSCOPIC EXAMINATION: Bacteria, UA: NONE SEEN

## 2023-01-21 NOTE — Progress Notes (Signed)
Marcelle Overlie Plume,acting as a scribe for Vanna Scotland, MD.,have documented all relevant documentation on the behalf of Vanna Scotland, MD,as directed by  Vanna Scotland, MD while in the presence of Vanna Scotland, MD.  01/21/2023 9:31 AM   [name] 1965/07/07 [id number]  Referring provider: Lauro Regulus, MD [address] [facility] [place] [facility] [place],  [place] [address]  Chief Complaint  Patient presents with   Establish Care   Elevated PSA    HPI: 57 year-old male who is a previously established patient with our practice and presents today for further evaluation of elevated PSA.   Notably, he was seen by me in 2021 for further evaluation of microscopic hematuria and underwent CT urogram and cystoscopy, which were unremarkable.   Today, he returns for evaluation of elevated PSA.   PSA was known to be 4.85 on 12/28/2022. Previously 3.24 on 12/31/2021.   He reports starting Flomax about five to six months ago, which improved urinary symptoms. Previously, he experienced difficulty urinating while on Flexeril, which was switched to metocarbamol by another physician. He also had issues with Aleve causing elevated blood pressure, which was managed by switching to gabapentin and receiving a hip injection.  He denies any family history of prostate cancer and has not experienced fever in 37 years, indicating a strong immune system.  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, 2014   Asthma    childhood asthma   CAD (coronary artery disease) 04/24/2021   History of PCI to proximal RCA 02/07/2014   Cardiomyopathy (HCC)    Coronary artery disease    Diabetes mellitus without complication (HCC)    APIDRA INSULIN PUMP   GERD (gastroesophageal reflux disease)    History of coronary angiogram 2003    Hyperlipidemia    Hypertension    Hyperthyroidism    Myocardial infarction [facility]) 2015   s/p stent to distal RCA 2015   Panic attack     Surgical History: Past Surgical History:  Procedure Laterality Date   CARDIAC CATHETERIZATION     COLONOSCOPY WITH PROPOFOL N/A 12/04/2020   Procedure: COLONOSCOPY WITH PROPOFOL;  Surgeon: Toledo, Boykin Nearing, MD;  Location: [facility] ENDOSCOPY;  Service: Gastroenterology;  Laterality: N/A;  IDDM   CORONARY ANGIOPLASTY     CORONARY STENT PLACEMENT     x1   SHOULDER ARTHROSCOPY WITH DISTAL CLAVICLE RESECTION Left 08/21/2015   Procedure: SHOULDER ARTHROSCOPY WITH DISTAL CLAVICLE RESECTION, SUBACROMIAL DECOMPRESSION, BICEPS TENOTOMY;  Surgeon: Deeann Saint, MD;  Location: [facility] ORS;  Service: Orthopedics;  Laterality: Left;   TONSILLECTOMY      Home Medications:  Allergies as of 01/21/2023       Reactions   Other Other (See Comments)   Other reaction(s): Unknown Pet dander Seasonal allergies Pet dander Seasonal allergies Other reaction(s): Unknown Pet dander Seasonal allergies        Medication List        Accurate as of January 21, 2023  9:31 AM. If you have any questions, ask your nurse or doctor.          aspirin EC 81 MG tablet Take by mouth.   Enulose 10 GM/15ML Soln Generic drug: lactulose (encephalopathy) Take by mouth.  fluocinonide ointment 0.05 % Commonly known as: LIDEX Apply topically 2 (two) times daily.   gabapentin 100 MG capsule Commonly known as: NEURONTIN Take 1 capsule by mouth 3 (three) times daily.   insulin aspart 100 UNIT/ML injection Commonly known as: novoLOG   lisinopril 2.5 MG tablet Commonly known as: ZESTRIL Take 2.5 mg by mouth at bedtime.   Lyumjev 100 UNIT/ML Soln Generic drug: Insulin Lispro-aabc Inject 100 Units into the skin daily.   metoprolol tartrate 12.5 mg Tabs tablet Commonly known as: LOPRESSOR Take 12.5 mg by mouth at bedtime.   pravastatin 20 MG tablet Commonly known as:  PRAVACHOL Take 20 mg by mouth at bedtime. Take 1 tablet by mouth at bedtime        Allergies:  Allergies  Allergen Reactions   Other Other (See Comments)    Other reaction(s): Unknown Pet dander Seasonal allergies Pet dander Seasonal allergies Other reaction(s): Unknown Pet dander Seasonal allergies     Social History:  reports that he quit smoking about 8 years ago. His smoking use included cigarettes. He started smoking about 40 years ago. He has a 32 pack-year smoking history. He has never used smokeless tobacco. He reports current alcohol use of about 5.0 standard drinks of alcohol per week. He reports that he does not use drugs.   Physical Exam: BP (!) 153/88   Constitutional:  Alert and oriented, No acute distress. HEENT: [place] AT, moist mucus membranes.  Trachea midline, no masses. Neurologic: Grossly intact, no focal deficits, moving all 4 extremities. GU: Enlarged, rubbery prostate with no nodules. Psychiatric: Normal mood and affect.   Assessment & Plan:    1. Elevated PSA -  We reviewed the implications of an elevated PSA and the uncertainty surrounding it. In general, a man's PSA increases with age and is produced by both normal and cancerous prostate tissue. Differential for elevated PSA is BPH, prostate cancer, infection, recent intercourse/ejaculation, prostate infarction, recent urethroscopic manipulation (foley placement/cystoscopy) and prostatitis. Management of an elevated PSA can include observation or prostate biopsy and wediscussed this in detail. We discussed that indications for prostate biopsy are defined by age and race specific PSA cutoffs as well as a PSA velocity of 0.75/year. - Differential diagnosis includes benign prostatic hyperplasia, prostatitis, and prostate cancer. - Plan to repeat PSA test and perform urinalysis to check for infection or inflammation. - Consider prostate biopsy if PSA levels continue to rise or if there are concerning  findings on repeat testing. - DRE today  F/u based on repeat lab results  I have reviewed the above documentation for accuracy and completeness, and I agree with the above.   Vanna Scotland, MD    [facility] [address] [place], [place] [address] [phone]

## 2023-01-22 ENCOUNTER — Other Ambulatory Visit: Payer: Self-pay

## 2023-01-22 DIAGNOSIS — R972 Elevated prostate specific antigen [PSA]: Secondary | ICD-10-CM

## 2023-01-22 LAB — PSA: Prostate Specific Ag, Serum: 3.8 ng/mL (ref 0.0–4.0)

## 2023-08-09 DIAGNOSIS — R079 Chest pain, unspecified: Secondary | ICD-10-CM | POA: Insufficient documentation

## 2023-08-10 ENCOUNTER — Other Ambulatory Visit: Payer: Self-pay | Admitting: Cardiology

## 2023-08-10 DIAGNOSIS — R002 Palpitations: Secondary | ICD-10-CM

## 2023-08-10 DIAGNOSIS — R079 Chest pain, unspecified: Secondary | ICD-10-CM

## 2023-08-10 DIAGNOSIS — I82402 Acute embolism and thrombosis of unspecified deep veins of left lower extremity: Secondary | ICD-10-CM

## 2023-08-16 ENCOUNTER — Ambulatory Visit
Admission: RE | Admit: 2023-08-16 | Discharge: 2023-08-16 | Disposition: A | Discharge status: Home / Self Care | Source: Ambulatory Visit | Attending: Cardiology | Admitting: Cardiology

## 2023-08-16 DIAGNOSIS — I82402 Acute embolism and thrombosis of unspecified deep veins of left lower extremity: Secondary | ICD-10-CM | POA: Insufficient documentation

## 2023-08-30 ENCOUNTER — Encounter
Admission: RE | Admit: 2023-08-30 | Discharge: 2023-08-30 | Disposition: A | Discharge status: Home / Self Care | Source: Ambulatory Visit | Attending: Cardiology | Admitting: Cardiology

## 2023-08-30 DIAGNOSIS — R002 Palpitations: Secondary | ICD-10-CM | POA: Insufficient documentation

## 2023-08-30 DIAGNOSIS — R079 Chest pain, unspecified: Secondary | ICD-10-CM | POA: Insufficient documentation

## 2023-08-30 MED ORDER — TECHNETIUM TC 99M TETROFOSMIN IV KIT
10.7300 | PACK | Freq: Once | INTRAVENOUS | Status: AC | PRN
Start: 2023-08-30 — End: 2023-08-30
  Administered 2023-08-30: 10.73 via INTRAVENOUS

## 2023-08-30 MED ORDER — TECHNETIUM TC 99M TETROFOSMIN IV KIT
27.0500 | PACK | Freq: Once | INTRAVENOUS | Status: AC | PRN
  Administered 2023-08-30: 27.05 via INTRAVENOUS

## 2023-09-01 LAB — NM MYOCAR MULTI W/SPECT W/WALL MOTION / EF
Angina Index: 0
Base ST Depression (mm): 0 mm
Estimated workload: 10.1
Exercise duration (min): 8 min
Exercise duration (sec): 30 s
LV dias vol: 133 mL (ref 62–150)
LV sys vol: 73 mL
MPHR: 163 {beats}/min
Nuc Stress EF: 45 %
Peak HR: 153 {beats}/min
Percent HR: 93 %
Rest HR: 64 {beats}/min
Rest Nuclear Isotope Dose: 10.7 mCi
SDS: 2
SRS: 4
SSS: 3
Stress Nuclear Isotope Dose: 27.1 mCi
TID: 0.91

## 2023-10-01 ENCOUNTER — Other Ambulatory Visit: Payer: Self-pay | Admitting: Neurology

## 2023-10-01 DIAGNOSIS — M5412 Radiculopathy, cervical region: Secondary | ICD-10-CM

## 2023-10-01 DIAGNOSIS — R200 Anesthesia of skin: Secondary | ICD-10-CM

## 2023-10-01 DIAGNOSIS — M542 Cervicalgia: Secondary | ICD-10-CM

## 2023-10-05 ENCOUNTER — Encounter: Payer: Self-pay | Admitting: Neurology

## 2023-10-19 ENCOUNTER — Other Ambulatory Visit: Payer: Self-pay

## 2023-10-19 ENCOUNTER — Ambulatory Visit
Admission: RE | Admit: 2023-10-19 | Discharge: 2023-10-19 | Disposition: A | Discharge status: Home / Self Care | Source: Ambulatory Visit | Attending: Neurology | Admitting: Neurology

## 2023-10-19 DIAGNOSIS — S43409A Unspecified sprain of unspecified shoulder joint, initial encounter: Secondary | ICD-10-CM | POA: Insufficient documentation

## 2023-10-19 DIAGNOSIS — M542 Cervicalgia: Secondary | ICD-10-CM

## 2023-10-19 DIAGNOSIS — R200 Anesthesia of skin: Secondary | ICD-10-CM

## 2023-10-19 DIAGNOSIS — M5412 Radiculopathy, cervical region: Secondary | ICD-10-CM

## 2023-12-27 ENCOUNTER — Other Ambulatory Visit: Payer: Self-pay

## 2023-12-27 DIAGNOSIS — R972 Elevated prostate specific antigen [PSA]: Secondary | ICD-10-CM

## 2023-12-28 ENCOUNTER — Other Ambulatory Visit: Payer: Self-pay | Admitting: Orthopedic Surgery

## 2023-12-28 DIAGNOSIS — G8929 Other chronic pain: Secondary | ICD-10-CM

## 2023-12-28 DIAGNOSIS — M25561 Pain in right knee: Secondary | ICD-10-CM

## 2024-01-01 ENCOUNTER — Ambulatory Visit
Admission: RE | Admit: 2024-01-01 | Discharge: 2024-01-01 | Disposition: A | Discharge status: Home / Self Care | Source: Ambulatory Visit | Attending: Orthopedic Surgery | Admitting: Orthopedic Surgery

## 2024-01-01 DIAGNOSIS — G8929 Other chronic pain: Secondary | ICD-10-CM

## 2024-01-01 DIAGNOSIS — M25561 Pain in right knee: Secondary | ICD-10-CM

## 2024-01-11 ENCOUNTER — Other Ambulatory Visit: Payer: Self-pay | Admitting: Family Medicine

## 2024-01-11 DIAGNOSIS — M5412 Radiculopathy, cervical region: Secondary | ICD-10-CM

## 2024-01-13 ENCOUNTER — Other Ambulatory Visit

## 2024-01-14 ENCOUNTER — Other Ambulatory Visit: Payer: Self-pay

## 2024-01-14 ENCOUNTER — Other Ambulatory Visit

## 2024-01-17 NOTE — Progress Notes (Deleted)
 Referring Physician:  Carlisle Benton CROME, FNP [address] [place],  [place] [address]  Primary Physician:  Lenon Layman ORN, MD  History of Present Illness: 01/17/2024 [name] is here today with a chief complaint of ***  Cervical radiculitis  Chronic bilateral numbness and tingling in the fingers? Pain radiating down the bilateral upper extremities that is intermittent? Bilateral upper extremity EMG/NCS dated 08/17/2023 by Dr. Lane revealed moderate carpal tunnel.   Duration: *** Location: *** Quality: *** Severity: ***  Precipitating: aggravated by *** Modifying factors: made better by *** Weakness: none Timing: *** Bowel/Bladder Dysfunction: none  Conservative measures:  Physical therapy: *** Is participating in @ [facility]. Multimodal medical therapy including regular antiinflammatories: *** methocarbamoL, gabapentin  Injections: ***  01/10/2024 Right C6-7   Past Surgery: ***  Wesley Daniels has ***no symptoms of cervical myelopathy.  The symptoms are causing a significant impact on the patient's life.   Review of Systems:  A 10 point review of systems is negative, except for the pertinent positives and negatives detailed in the HPI.  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, 2014   Asthma    childhood asthma   CAD (coronary artery disease) 04/24/2021   History of PCI to proximal RCA 02/07/2014   Cardiomyopathy (HCC)    Coronary artery disease    Diabetes mellitus without complication (HCC)    APIDRA INSULIN  PUMP   GERD (gastroesophageal reflux disease)    History of coronary angiogram 2003   Hyperlipidemia    Hypertension    Hyperthyroidism    Myocardial infarction [facility]) 2015   s/p stent to distal RCA 2015   Panic attack     Past Surgical History: Past Surgical History:  Procedure Laterality Date   CARDIAC CATHETERIZATION     COLONOSCOPY WITH PROPOFOL  N/A  12/04/2020   Procedure: COLONOSCOPY WITH PROPOFOL ;  Surgeon: Toledo, Ladell POUR, MD;  Location: [facility] ENDOSCOPY;  Service: Gastroenterology;  Laterality: N/A;  IDDM   CORONARY ANGIOPLASTY     CORONARY STENT PLACEMENT     x1   SHOULDER ARTHROSCOPY WITH DISTAL CLAVICLE RESECTION Left 08/21/2015   Procedure: SHOULDER ARTHROSCOPY WITH DISTAL CLAVICLE RESECTION, SUBACROMIAL DECOMPRESSION, BICEPS TENOTOMY;  Surgeon: Kayla Pinal, MD;  Location: [facility] ORS;  Service: Orthopedics;  Laterality: Left;   TONSILLECTOMY      Allergies: Allergies as of 01/19/2024 - Review Complete 01/21/2023  Allergen Reaction Noted   Cat dander  03/27/2021   Other Other (See Comments) 09/06/2014    Medications: Outpatient Encounter Medications as of 01/19/2024  Medication Sig   aspirin EC 81 MG tablet Take by mouth.   atorvastatin (LIPITOR) 20 MG tablet Take 20 mg by mouth daily.   celecoxib (CELEBREX) 200 MG capsule Take 200 mg by mouth daily.   fluocinonide ointment (LIDEX) 0.05 % Apply topically 2 (two) times daily.   Glucosamine-Chondroitin (GLUCOSAMINE CHONDR COMPLEX PO) Take by mouth.   Insulin  Lispro-aabc (LYUMJEV) 100 UNIT/ML SOLN Inject 100 Units into the skin daily.   lactulose (CHRONULAC) 10 GM/15ML solution Take 10 g by mouth daily.   lactulose, encephalopathy, (CHRONULAC) 10 GM/15ML SOLN Take 15 mLs by mouth daily.   lisinopril  (ZESTRIL ) 10 MG tablet Take 1 tablet by mouth daily.   lisinopril -hydrochlorothiazide (ZESTORETIC) 20-12.5 MG tablet Take 1 tablet by mouth daily.   Magnesium Bisglycinate (MAG GLYCINATE PO) Take by mouth.   meloxicam (MOBIC) 7.5 MG tablet Take 1 tablet  by mouth 2 (two) times daily as needed.   methocarbamol (ROBAXIN) 500 MG tablet Take 500 mg by mouth every 6 (six) hours as needed for muscle spasms.   metoprolol  tartrate (LOPRESSOR ) 25 MG tablet Take 12.5 mg by mouth 2 (two) times daily.   Multiple Vitamin (MULTIVITAMIN) capsule Take 1 capsule by mouth daily.   mupirocin  ointment (BACTROBAN) 2 % Apply 1 Application topically 2 (two) times daily.   Omega-3 Fatty Acids (FISH OIL) 1000 MG CAPS Take by mouth.   pravastatin  (PRAVACHOL ) 20 MG tablet Take 20 mg by mouth at bedtime. Take 1 tablet by mouth at bedtime   sodium chloride  (ALTAMIST SPRAY) 0.65 % nasal spray Place into the nose as needed.   tamsulosin (FLOMAX) 0.4 MG CAPS capsule Take 0.4 mg by mouth daily.   No facility-administered encounter medications on file as of 01/19/2024.    Social History: Social History   Tobacco Use   Smoking status: Former    Current packs/day: 0.00    Average packs/day: 1 pack/day for 32.0 years (32.0 ttl pk-yrs)    Types: Cigarettes    Start date: 02/06/1982    Quit date: 02/06/2014    Years since quitting: 9.9   Smokeless tobacco: Never  Vaping Use   Vaping status: Never Used  Substance Use Topics   Alcohol use: Yes    Alcohol/week: 5.0 standard drinks of alcohol    Types: 5 Glasses of wine per week    Comment: qmonth   Drug use: No    Family Medical History: No family history on file.  Physical Examination: @VITALWITHPAIN @  General: Patient is well developed, well nourished, calm, collected, and in no apparent distress. Attention to examination is appropriate.  Psychiatric: Patient is non-anxious.  Head:  Pupils equal, round, and reactive to light.  ENT:  Oral mucosa appears well hydrated.  Neck:   Supple.  ***Full range of motion.  Respiratory: Patient is breathing without any difficulty.  Extremities: No edema.  Vascular: Palpable dorsal pedal pulses.  Skin:   On exposed skin, there are no abnormal skin lesions.  NEUROLOGICAL:     Awake, alert, oriented to person, place, and time.  Speech is clear and fluent. Fund of knowledge is appropriate.   Cranial Nerves: Pupils equal round and reactive to light.  Facial tone is symmetric.  Facial sensation is symmetric.  ROM of spine: ***full.  Palpation of spine: ***non tender.     Strength: Side Biceps Triceps Deltoid Interossei Grip Wrist Ext. Wrist Flex.  R 5 5 5 5 5 5 5   L 5 5 5 5 5 5 5    Side Iliopsoas Quads Hamstring PF DF EHL  R 5 5 5 5 5 5   L 5 5 5 5 5 5    Reflexes are ***2+ and symmetric at the biceps, triceps, brachioradialis, patella and achilles.   Hoffman's is absent.  Clonus is not present.  Toes are down-going.  Bilateral upper and lower extremity sensation is intact to light touch.    Gait is normal.   No difficulty with tandem gait.   No evidence of dysmetria noted.  Medical Decision Making  Imaging: ***  I have personally reviewed the images and agree with the above interpretation.  Assessment and Plan: Wesley Daniels is a pleasant 58 y.o. male with ***    Thank you for involving me in the care of this patient.   I spent a total of *** minutes in both face-to-face and non-face-to-face activities for this  visit on the date of this encounter.   Lyle Decamp, PA-C Dept. of Neurosurgery

## 2024-01-18 ENCOUNTER — Ambulatory Visit: Admitting: Physician Assistant

## 2024-01-18 ENCOUNTER — Ambulatory Visit: Payer: Self-pay | Admitting: Urology

## 2024-01-19 ENCOUNTER — Ambulatory Visit: Admitting: Physician Assistant

## 2024-01-24 ENCOUNTER — Other Ambulatory Visit: Payer: Self-pay | Admitting: Physical Medicine & Rehabilitation

## 2024-01-24 DIAGNOSIS — M5412 Radiculopathy, cervical region: Secondary | ICD-10-CM

## 2024-01-26 NOTE — Discharge Instructions (Signed)

## 2024-01-27 ENCOUNTER — Inpatient Hospital Stay
Admission: RE | Admit: 2024-01-27 | Discharge: 2024-01-27 | Disposition: A | Discharge status: Home / Self Care | Source: Ambulatory Visit | Attending: Family Medicine | Admitting: Family Medicine

## 2024-02-03 ENCOUNTER — Ambulatory Visit
Admission: RE | Admit: 2024-02-03 | Discharge: 2024-02-03 | Disposition: A | Discharge status: Home / Self Care | Source: Ambulatory Visit | Attending: Family Medicine | Admitting: Family Medicine

## 2024-02-03 DIAGNOSIS — M5412 Radiculopathy, cervical region: Secondary | ICD-10-CM

## 2024-02-03 MED ORDER — TRIAMCINOLONE ACETONIDE 40 MG/ML IJ SUSP (RADIOLOGY)
60.0000 mg | Freq: Once | INTRAMUSCULAR | Status: AC
  Administered 2024-02-03: 60 mg via EPIDURAL

## 2024-02-03 MED ORDER — IOPAMIDOL (ISOVUE-300) INJECTION 61%
3.0000 mL | Freq: Once | INTRAVENOUS | Status: AC | PRN
  Administered 2024-02-03: 3 mL

## 2024-02-03 NOTE — Discharge Instructions (Signed)

## 2024-02-07 ENCOUNTER — Ambulatory Visit: Admitting: Physician Assistant

## 2024-03-07 ENCOUNTER — Ambulatory Visit: Admitting: Physician Assistant

## 2024-03-07 ENCOUNTER — Encounter: Payer: Self-pay | Admitting: Physician Assistant

## 2024-03-07 VITALS — BP 122/78 | Ht 72.0 in | Wt 242.0 lb

## 2024-03-07 DIAGNOSIS — M4802 Spinal stenosis, cervical region: Secondary | ICD-10-CM | POA: Diagnosis not present

## 2024-03-07 DIAGNOSIS — M50223 Other cervical disc displacement at C6-C7 level: Secondary | ICD-10-CM

## 2024-03-07 DIAGNOSIS — M47812 Spondylosis without myelopathy or radiculopathy, cervical region: Secondary | ICD-10-CM

## 2024-03-07 NOTE — Progress Notes (Signed)
 Referring Physician:  Carlisle Benton CROME, FNP [address] [place],  [place] [address]  Primary Physician:  Lenon Layman ORN, MD  History of Present Illness: 03/07/2024 [name] is here today with a chief complaint of longstanding neck pain with numbness in bilateral hands.  He recently had a cervical injection and as a result his neck pain has decreased tremendously.  He no longer has radiating pain down his arms like he used to or numbness in his hands.  He has been very pleased with his result.  He is completed physical therapy.  He denies any issues with ambulation or weakness in bilateral upper extremities.  Denies any saddle anesthesia or incontinence of bowel or bladder.  Weakness: none  Bowel/Bladder Dysfunction: none  Conservative measures:  Physical therapy: has completed Multimodal medical therapy including regular antiinflammatories:  Injections:  02/03/2024 left at C6-C7--> helped some.   Past Surgery:   Wesley Daniels has no symptoms of cervical myelopathy.  The symptoms are causing a significant impact on the patient's life.   Review of Systems:  A 10 point review of systems is negative, except for the pertinent positives and negatives detailed in the HPI.  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, 2014   Asthma    childhood asthma   CAD (coronary artery disease) 04/24/2021   History of PCI to proximal RCA 02/07/2014   Cardiomyopathy (HCC)    Coronary artery disease    Diabetes mellitus without complication (HCC)    APIDRA INSULIN  PUMP   GERD (gastroesophageal reflux disease)    History of coronary angiogram 2003   Hyperlipidemia    Hypertension    Hyperthyroidism    Myocardial infarction [facility]) 2015   s/p stent to distal RCA 2015   Panic attack     Past Surgical History: Past Surgical History:  Procedure Laterality Date   CARDIAC CATHETERIZATION      COLONOSCOPY WITH PROPOFOL  N/A 12/04/2020   Procedure: COLONOSCOPY WITH PROPOFOL ;  Surgeon: Toledo, Ladell POUR, MD;  Location: [facility] ENDOSCOPY;  Service: Gastroenterology;  Laterality: N/A;  IDDM   CORONARY ANGIOPLASTY     CORONARY STENT PLACEMENT     x1   SHOULDER ARTHROSCOPY WITH DISTAL CLAVICLE RESECTION Left 08/21/2015   Procedure: SHOULDER ARTHROSCOPY WITH DISTAL CLAVICLE RESECTION, SUBACROMIAL DECOMPRESSION, BICEPS TENOTOMY;  Surgeon: Kayla Pinal, MD;  Location: [facility] ORS;  Service: Orthopedics;  Laterality: Left;   TONSILLECTOMY      Allergies: Allergies as of 03/07/2024 - Review Complete 03/07/2024  Allergen Reaction Noted   Cat dander  03/27/2021   Other Other (See Comments) 09/06/2014    Medications: Outpatient Encounter Medications as of 03/07/2024  Medication Sig   aspirin EC 81 MG tablet Take by mouth.   atorvastatin (LIPITOR) 20 MG tablet Take 20 mg by mouth daily.   fluocinonide ointment (LIDEX) 0.05 % Apply topically 2 (two) times daily.   Glucosamine-Chondroitin (GLUCOSAMINE CHONDR COMPLEX PO) Take by mouth.   Insulin  Lispro-aabc (LYUMJEV) 100 UNIT/ML SOLN Inject 100 Units into the skin daily.   lactulose, encephalopathy, (CHRONULAC) 10 GM/15ML SOLN Take 15 mLs by mouth daily.   lisinopril -hydrochlorothiazide (ZESTORETIC) 20-12.5 MG tablet Take 1 tablet by mouth daily.   Magnesium Bisglycinate (MAG GLYCINATE PO) Take by mouth.   meloxicam (MOBIC) 7.5 MG tablet Take 1 tablet by mouth 2 (two) times daily as needed.   methocarbamol (ROBAXIN) 500 MG tablet Take 500  mg by mouth every 6 (six) hours as needed for muscle spasms.   metoprolol  tartrate (LOPRESSOR ) 25 MG tablet Take 12.5 mg by mouth 2 (two) times daily.   Multiple Vitamin (MULTIVITAMIN) capsule Take 1 capsule by mouth daily.   Omega-3 Fatty Acids (FISH OIL) 1000 MG CAPS Take by mouth.   sodium chloride  (ALTAMIST SPRAY) 0.65 % nasal spray Place into the nose as needed.   tamsulosin (FLOMAX) 0.4 MG CAPS capsule  Take 0.4 mg by mouth daily.   triamcinolone  cream (KENALOG ) 0.1 % APPLY TWICE DAILY AS NEEDED TO INVOLVED AREAS. THIS SHOULD BE TARGETED MAINLY AT ITCHY OR INFLAMED AREAS. ONLY USE AS NEEDED. DO NOT USE REGULARLY OR DAILY LIKE A MOISTURIZER. USE FOR PRESCRIBED PURPOSE AND THEN DISCONTINUE WHEN RESOLVED.   [DISCONTINUED] celecoxib (CELEBREX) 200 MG capsule Take 200 mg by mouth daily.   [DISCONTINUED] lactulose (CHRONULAC) 10 GM/15ML solution Take 10 g by mouth daily.   [DISCONTINUED] lisinopril  (ZESTRIL ) 10 MG tablet Take 1 tablet by mouth daily.   [DISCONTINUED] mupirocin ointment (BACTROBAN) 2 % Apply 1 Application topically 2 (two) times daily.   [DISCONTINUED] pravastatin  (PRAVACHOL ) 20 MG tablet Take 20 mg by mouth at bedtime. Take 1 tablet by mouth at bedtime   No facility-administered encounter medications on file as of 03/07/2024.    Social History: Social History   Tobacco Use   Smoking status: Former    Current packs/day: 0.00    Average packs/day: 1 pack/day for 32.0 years (32.0 ttl pk-yrs)    Types: Cigarettes    Start date: 02/06/1982    Quit date: 02/06/2014    Years since quitting: 10.0   Smokeless tobacco: Never  Vaping Use   Vaping status: Never Used  Substance Use Topics   Alcohol use: Yes    Alcohol/week: 5.0 standard drinks of alcohol    Types: 5 Glasses of wine per week    Comment: qmonth   Drug use: No    Family Medical History: No family history on file.  Physical Examination: @VITALWITHPAIN @  General: Patient is well developed, well nourished, calm, collected, and in no apparent distress. Attention to examination is appropriate.  Psychiatric: Patient is non-anxious.  Head:  Pupils equal, round, and reactive to light.  ENT:  Oral mucosa appears well hydrated.  Neck:   Supple.    Respiratory: Patient is breathing without any difficulty.  Extremities: No edema.  Vascular: Palpable dorsal pedal pulses.  Skin:   On exposed skin, there are no  abnormal skin lesions.  NEUROLOGICAL:     Awake, alert, oriented to person, place, and time.  Speech is clear and fluent. Fund of knowledge is appropriate.   Cranial Nerves: Pupils equal round and reactive to light.  Facial tone is symmetric.  ROM of spine: Nontender to palpation of cervical spine.  Strength: Side Biceps Triceps Deltoid Interossei Grip Wrist Ext. Wrist Flex.  R 5 5 5 5 5 5 5   L 5 5 5 5 5 5 5     Grossly hyporeflexic throughout.  No Hoffman sign.  Does have some decrease sensation in his upper extremities.  No difficulty with tandem gait.  Medical Decision Making  Imaging: EXAM: MRI CERVICAL SPINE WITHOUT CONTRAST   TECHNIQUE: Multiplanar, multisequence MR imaging of the cervical spine was performed. No intravenous contrast was administered.   COMPARISON:  None Available.   FINDINGS: Alignment: Degenerative cervical spondylosis with mild multilevel degenerative subluxations but the overall alignment is maintained.   Vertebrae: No fracture, evidence of discitis,  or bone lesion.   Cord: Normal cord signal intensity.  No cord lesions or syrinx.   Posterior Fossa, vertebral arteries, paraspinal tissues: No significant findings.   Disc levels:   C2-3: Central annular bulge with mild impression on the ventral thecal sac but no spinal or foraminal stenosis.   C3-4: Mild annular bulge and mild to moderate facet disease but no focal disc protrusions, spinal or foraminal stenosis.   C4-5: Central and left paracentral disc protrusion with mass effect on the ventral thecal sac and significant narrowing of the ventral CSF space. There is also left foraminal involvement medially with moderate foraminal stenosis. No right-sided foraminal stenosis.   C5-6: Bulging degenerated annulus and shallow central disc protrusion with mass effect on the ventral thecal sac and focal narrowing the ventral CSF space. Could not exclude the possibility of ossification of the  posterior longitudinal ligament extending down to the C6-7 disc space. CT may be helpful for further evaluation. Medial foraminal narrowing on the right.   C6-7: Central disc protrusion with mass effect on the ventral thecal sac and narrowing the ventral CSF space. No foraminal stenosis.   C7-T1: No significant findings.   IMPRESSION: 1. Degenerative cervical spondylosis with multilevel disc disease and facet disease. 2. Central and left paracentral disc protrusion at C4-5 with mass effect on the ventral thecal sac and significant narrowing of the ventral CSF space. There is also left foraminal involvement medially with moderate foraminal stenosis. 3. Bulging degenerated annulus and shallow central disc protrusion at C5-6 with mass effect on the ventral thecal sac and focal narrowing the ventral CSF space. Could not exclude the possibility of ossification of the posterior longitudinal ligament extending down to the C6-7 disc space. CT may be helpful for further evaluation. Medial foraminal narrowing on the right. 4. Central disc protrusion at C6-7 with mass effect on the ventral thecal sac and narrowing the ventral CSF space.  Wesley Arthea Locus, MD     09/09/2023 10:22 AM     [facility] - Neurology Department  [address]  [place], [place] [address]  [phone] (Phone);  [phone] (Fax)  Test Date:  08/17/2023   Patient: Wesley Daniels DOB: 13-Apr-1966 Physician: Dr. Arthea Wesley  Chart#: [id number] Sex: Male Ref Phys: Dr. Lenon   Patient History:  Patient is a 58 year-old male who presents with bilateral hand  numbness, tinging, weakness.  Denies neck pain.  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 Past medical  history is significant for diabetes.    Exam:  Decreased sensation in both hands in a median nerve distribution  primarily.  Also there's decreased sensation in the legs in a  length dependent fashion.   EMG & NCV  Findings:  Evaluation of the Left median motor and the Right median motor  nerves showed prolonged distal onset latency (L5.9, R5.1 ms).   The Left median sensory and the Right median sensory nerves  showed prolonged distal peak latency (L5.5, R5.3 ms) and  decreased conduction velocity (Wrist-2nd Digit, L24, R25 m/s).   The Left radial sensory nerve showed prolonged distal peak  latency (2.6 ms).  The Left ulnar sensory nerve showed reduced  amplitude (14.2 V).  The Right ulnar sensory nerve showed  prolonged distal peak latency (4.0 ms) and decreased conduction  velocity (Wrist-5th Digit, 30 m/s).  The Left median/ulnar (palm)  comparison nerve showed prolonged distal peak latency (Median  Palm, 3.9 ms), prolonged distal peak latency (Ulnar Palm, 3.3  ms), and abnormal peak latency difference (Median Palm-Ulnar  Palm, 0.6 ms).  The Right median/ulnar (palm) comparison nerve  showed prolonged distal peak latency (Median Palm, 3.6 ms) and  abnormal peak latency difference (Median Palm-Ulnar Palm, 1.8  ms).  All remaining nerves (as indicated in the following tables)  were within normal limits.    EMG    Side Muscle Nerve Root Ins Act Fibs Psw Amp Dur Poly Recrt Int  Bruna Comment  Right Abd Poll Brev Median C8-T1 Nml Nml Nml Nml Nml 0 Nml Nml   Right 1stDorInt Ulnar C8-T1 Nml Nml Nml Nml Nml 0 Nml Nml   Right PronatorTeres Median C6-7 Nml Nml Nml Nml Nml 0 Nml Nml   Right Biceps Musculocut C5-6 Nml Nml Nml Nml Nml 0 Nml Nml   Right Triceps Radial C6-7-8 Nml Nml Nml Nml Nml 0 Nml Nml    Impression:  Abnormal study.  There is electrodiagnostic evidence of chronic,  moderate bilateral carpal tunnel syndrome.     Thank you for the referral of this patient. It was our privilege  to participate in care of your patient.  Feel free to contact us   with any further questions.     I have personally reviewed the images and agree with the above interpretation.  Assessment and Plan: Mr.  Daniels is a pleasant 58 y.o. male with known cervical spondylosis with multilevel disc disease with effect on his cord secondary to his stenosis and disc protrusion seen on MRI.  However patient recently had an injection at C6-7 which has resolved his neck pain and the numbness and tingling radiating down his upper extremities.  He has no frank weakness and no issues with his gait.  At this point I discussed with the patient potential plan moving forward.  He states that he would like to avoid surgery at all costs.  I did discuss the increased risk of permanent damage to his spinal cord.  I discussed red flag symptoms with him in which he was told to contact us  immediately.  At this point he would like to follow-up with the pain team as needed and will let us  know if he has any increased weakness, numbness, tingling, changes to his gait.  I did talk to him as well about the likely need of surgery in the future.  He did express understanding and risks of conservative management.  He may follow-up with us  on an as-needed basis.  Thank you for involving me in the care of this patient.     Lyle Decamp, PA-C Dept. of Neurosurgery
